# Patient Record
Sex: Male | Born: 1955 | Race: White | Hispanic: No | Marital: Married | Smoking: Smoker, current status unknown
Health system: Southern US, Community
[De-identification: ages and names within clinical notes are randomized; demographics above are authoritative.]

## PROBLEM LIST (undated history)

## (undated) DIAGNOSIS — R188 Other ascites: Secondary | ICD-10-CM

## (undated) DIAGNOSIS — C220 Liver cell carcinoma: Secondary | ICD-10-CM

## (undated) DIAGNOSIS — I8500 Esophageal varices without bleeding: Secondary | ICD-10-CM

## (undated) DIAGNOSIS — B1920 Unspecified viral hepatitis C without hepatic coma: Secondary | ICD-10-CM

## (undated) DIAGNOSIS — K7460 Unspecified cirrhosis of liver: Secondary | ICD-10-CM

## (undated) DIAGNOSIS — A498 Other bacterial infections of unspecified site: Secondary | ICD-10-CM

## (undated) HISTORY — DX: Other ascites: R18.8

---
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
## 2014-01-28 ENCOUNTER — Inpatient Hospital Stay (HOSPITAL_COMMUNITY)
Admission: EM | Admit: 2014-01-28 | Discharge: 2014-02-01 | DRG: 432 | Disposition: A | Discharge status: Home / Self Care | Payer: Medicaid Other | Attending: Internal Medicine | Admitting: Internal Medicine

## 2014-01-28 ENCOUNTER — Encounter (HOSPITAL_COMMUNITY)
Admission: EM | Disposition: A | Discharge status: Home / Self Care | Payer: Medicaid Other | Source: Home / Self Care | Attending: Internal Medicine

## 2014-01-28 ENCOUNTER — Inpatient Hospital Stay (HOSPITAL_COMMUNITY): Payer: Medicaid Other

## 2014-01-28 ENCOUNTER — Encounter (HOSPITAL_COMMUNITY): Payer: Self-pay | Admitting: Emergency Medicine

## 2014-01-28 DIAGNOSIS — R7401 Elevation of levels of liver transaminase levels: Secondary | ICD-10-CM

## 2014-01-28 DIAGNOSIS — E875 Hyperkalemia: Secondary | ICD-10-CM | POA: Diagnosis present

## 2014-01-28 DIAGNOSIS — K7460 Unspecified cirrhosis of liver: Principal | ICD-10-CM | POA: Diagnosis present

## 2014-01-28 DIAGNOSIS — D62 Acute posthemorrhagic anemia: Secondary | ICD-10-CM

## 2014-01-28 DIAGNOSIS — I8511 Secondary esophageal varices with bleeding: Secondary | ICD-10-CM | POA: Diagnosis present

## 2014-01-28 DIAGNOSIS — K922 Gastrointestinal hemorrhage, unspecified: Secondary | ICD-10-CM | POA: Diagnosis present

## 2014-01-28 DIAGNOSIS — D649 Anemia, unspecified: Secondary | ICD-10-CM

## 2014-01-28 DIAGNOSIS — Z7982 Long term (current) use of aspirin: Secondary | ICD-10-CM

## 2014-01-28 DIAGNOSIS — I959 Hypotension, unspecified: Secondary | ICD-10-CM

## 2014-01-28 DIAGNOSIS — R740 Nonspecific elevation of levels of transaminase and lactic acid dehydrogenase [LDH]: Secondary | ICD-10-CM

## 2014-01-28 DIAGNOSIS — D72829 Elevated white blood cell count, unspecified: Secondary | ICD-10-CM

## 2014-01-28 DIAGNOSIS — K921 Melena: Secondary | ICD-10-CM | POA: Diagnosis present

## 2014-01-28 DIAGNOSIS — B1920 Unspecified viral hepatitis C without hepatic coma: Secondary | ICD-10-CM

## 2014-01-28 DIAGNOSIS — F17200 Nicotine dependence, unspecified, uncomplicated: Secondary | ICD-10-CM | POA: Diagnosis present

## 2014-01-28 DIAGNOSIS — Z720 Tobacco use: Secondary | ICD-10-CM

## 2014-01-28 DIAGNOSIS — A0472 Enterocolitis due to Clostridium difficile, not specified as recurrent: Secondary | ICD-10-CM

## 2014-01-28 DIAGNOSIS — K766 Portal hypertension: Secondary | ICD-10-CM | POA: Diagnosis present

## 2014-01-28 DIAGNOSIS — Z23 Encounter for immunization: Secondary | ICD-10-CM

## 2014-01-28 HISTORY — PX: ESOPHAGOGASTRODUODENOSCOPY: SHX5428

## 2014-01-28 LAB — CBC
HCT: 25 % — ABNORMAL LOW (ref 39.0–52.0)
HCT: 20.9 % — ABNORMAL LOW (ref 39.0–52.0)
HEMOGLOBIN: 8.5 g/dL — AB (ref 13.0–17.0)
Hemoglobin: 7.4 g/dL — ABNORMAL LOW (ref 13.0–17.0)
MCH: 33.1 pg (ref 26.0–34.0)
MCH: 34.1 pg — ABNORMAL HIGH (ref 26.0–34.0)
MCHC: 34 g/dL (ref 30.0–36.0)
MCHC: 35.4 g/dL (ref 30.0–36.0)
MCV: 97.3 fL (ref 78.0–100.0)
MCV: 96.3 fL (ref 78.0–100.0)
PLATELETS: 127 10*3/uL — AB (ref 150–400)
Platelets: 182 10*3/uL (ref 150–400)
RBC: 2.57 MIL/uL — ABNORMAL LOW (ref 4.22–5.81)
RBC: 2.17 MIL/uL — ABNORMAL LOW (ref 4.22–5.81)
RDW: 15.2 % (ref 11.5–15.5)
RDW: 15.3 % (ref 11.5–15.5)
WBC: 23.3 10*3/uL — AB (ref 4.0–10.5)
WBC: 18.2 10*3/uL — ABNORMAL HIGH (ref 4.0–10.5)

## 2014-01-28 LAB — BASIC METABOLIC PANEL
BUN: 52 mg/dL — ABNORMAL HIGH (ref 6–23)
BUN: 48 mg/dL — ABNORMAL HIGH (ref 6–23)
CO2: 19 mEq/L (ref 19–32)
CO2: 17 mEq/L — ABNORMAL LOW (ref 19–32)
CREATININE: 1.1 mg/dL (ref 0.50–1.35)
Calcium: 8.1 mg/dL — ABNORMAL LOW (ref 8.4–10.5)
Calcium: 7.8 mg/dL — ABNORMAL LOW (ref 8.4–10.5)
Chloride: 101 mEq/L (ref 96–112)
Chloride: 105 mEq/L (ref 96–112)
Creatinine, Ser: 1.27 mg/dL (ref 0.50–1.35)
GFR calc Af Amer: 70 mL/min — ABNORMAL LOW (ref >90)
GFR calc non Af Amer: 72 mL/min — ABNORMAL LOW (ref >90)
GFR, EST AFRICAN AMERICAN: 83 mL/min — AB (ref >90)
GFR, EST NON AFRICAN AMERICAN: 60 mL/min — AB (ref >90)
Glucose, Bld: 102 mg/dL — ABNORMAL HIGH (ref 70–99)
Glucose, Bld: 104 mg/dL — ABNORMAL HIGH (ref 70–99)
POTASSIUM: 5.7 meq/L — AB (ref 3.7–5.3)
Potassium: 5.7 mEq/L — ABNORMAL HIGH (ref 3.7–5.3)
Sodium: 136 mEq/L — ABNORMAL LOW (ref 137–147)
Sodium: 138 mEq/L (ref 137–147)

## 2014-01-28 LAB — AMMONIA: AMMONIA: 38 umol/L (ref 11–60)

## 2014-01-28 LAB — COMPREHENSIVE METABOLIC PANEL
ALBUMIN: 2.6 g/dL — AB (ref 3.5–5.2)
ALK PHOS: 105 U/L (ref 39–117)
ALT: 139 U/L — ABNORMAL HIGH (ref 0–53)
AST: 166 U/L — AB (ref 0–37)
BUN: 51 mg/dL — ABNORMAL HIGH (ref 6–23)
CHLORIDE: 102 meq/L (ref 96–112)
CO2: 19 mEq/L (ref 19–32)
Calcium: 8.2 mg/dL — ABNORMAL LOW (ref 8.4–10.5)
Creatinine, Ser: 1.27 mg/dL (ref 0.50–1.35)
GFR calc Af Amer: 70 mL/min — ABNORMAL LOW (ref >90)
GFR calc non Af Amer: 60 mL/min — ABNORMAL LOW (ref >90)
Glucose, Bld: 109 mg/dL — ABNORMAL HIGH (ref 70–99)
Potassium: 6 mEq/L — ABNORMAL HIGH (ref 3.7–5.3)
SODIUM: 137 meq/L (ref 137–147)
Total Bilirubin: 2.6 mg/dL — ABNORMAL HIGH (ref 0.3–1.2)
Total Protein: 5.9 g/dL — ABNORMAL LOW (ref 6.0–8.3)

## 2014-01-28 LAB — PROTIME-INR
INR: 1.58 — ABNORMAL HIGH (ref 0.00–1.49)
Prothrombin Time: 18.4 seconds — ABNORMAL HIGH (ref 11.6–15.2)

## 2014-01-28 LAB — TROPONIN I: Troponin I: <0.3 ng/mL (ref <0.30)

## 2014-01-28 LAB — PREPARE RBC (CROSSMATCH)

## 2014-01-28 LAB — I-STAT CHEM 8, ED
BUN: 47 mg/dL — AB (ref 6–23)
CHLORIDE: 105 meq/L (ref 96–112)
Calcium, Ion: 1.07 mmol/L — ABNORMAL LOW (ref 1.12–1.23)
Creatinine, Ser: 1.3 mg/dL (ref 0.50–1.35)
Glucose, Bld: 103 mg/dL — ABNORMAL HIGH (ref 70–99)
HCT: 28 % — ABNORMAL LOW (ref 39.0–52.0)
Hemoglobin: 9.5 g/dL — ABNORMAL LOW (ref 13.0–17.0)
POTASSIUM: 5.6 meq/L — AB (ref 3.7–5.3)
SODIUM: 138 meq/L (ref 137–147)
TCO2: 20 mmol/L (ref 0–100)

## 2014-01-28 LAB — MRSA PCR SCREENING: MRSA BY PCR: NEGATIVE

## 2014-01-28 LAB — LIPASE, BLOOD: Lipase: 40 U/L (ref 11–59)

## 2014-01-28 LAB — ABO/RH: ABO/RH(D): O POS

## 2014-01-28 LAB — APTT: APTT: 34 s (ref 24–37)

## 2014-01-28 LAB — I-STAT CG4 LACTIC ACID, ED: Lactic Acid, Venous: 4.13 mmol/L — ABNORMAL HIGH (ref 0.5–2.2)

## 2014-01-28 LAB — POC OCCULT BLOOD, ED: FECAL OCCULT BLD: POSITIVE — AB

## 2014-01-28 SURGERY — EGD (ESOPHAGOGASTRODUODENOSCOPY)
Anesthesia: Moderate Sedation

## 2014-01-28 MED ORDER — SODIUM CHLORIDE 0.9 % IV SOLN
1000.0000 mL | Freq: Once | INTRAVENOUS | Status: AC
  Administered 2014-01-28: 1000 mL via INTRAVENOUS

## 2014-01-28 MED ORDER — BUTAMBEN-TETRACAINE-BENZOCAINE 2-2-14 % EX AERO
INHALATION_SPRAY | CUTANEOUS | Status: DC | PRN
  Administered 2014-01-28: 2 via TOPICAL

## 2014-01-28 MED ORDER — ONDANSETRON HCL 4 MG PO TABS
4.0000 mg | ORAL_TABLET | Freq: Four times a day (QID) | ORAL | Status: DC | PRN
  Administered 2014-01-29 – 2014-01-30 (×4): 4 mg via ORAL
  Filled 2014-01-28 (×4): qty 1

## 2014-01-28 MED ORDER — DEXTROSE 5 % IV SOLN: 1.0000 g | INTRAVENOUS | Status: DC

## 2014-01-28 MED ORDER — SODIUM CHLORIDE 0.9 % IV SOLN
1000.0000 mL | INTRAVENOUS | Status: DC
  Administered 2014-01-28: 1000 mL via INTRAVENOUS

## 2014-01-28 MED ORDER — MORPHINE SULFATE 2 MG/ML IJ SOLN
2.0000 mg | INTRAMUSCULAR | Status: DC | PRN
  Administered 2014-01-28 – 2014-01-29 (×5): 2 mg via INTRAVENOUS
  Administered 2014-01-30: 4 mg via INTRAVENOUS
  Filled 2014-01-28: qty 1
  Filled 2014-01-28: qty 2
  Filled 2014-01-28 (×4): qty 1

## 2014-01-28 MED ORDER — ONDANSETRON HCL 4 MG/2ML IJ SOLN: 4.0000 mg | Freq: Four times a day (QID) | INTRAMUSCULAR | Status: DC | PRN

## 2014-01-28 MED ORDER — SODIUM CHLORIDE 0.9 % IV SOLN
80.0000 mg | Freq: Once | INTRAVENOUS | Status: AC
  Administered 2014-01-28: 80 mg via INTRAVENOUS
  Filled 2014-01-28: qty 80

## 2014-01-28 MED ORDER — SODIUM CHLORIDE 0.9 % IV SOLN
INTRAVENOUS | Status: DC
  Administered 2014-01-28: 13:00:00 via INTRAVENOUS

## 2014-01-28 MED ORDER — SODIUM CHLORIDE 0.9 % IV SOLN
INTRAVENOUS | Status: DC
  Administered 2014-01-28 – 2014-01-29 (×3): via INTRAVENOUS
  Administered 2014-01-29: 1000 mL via INTRAVENOUS
  Administered 2014-01-29: 150 mL/h via INTRAVENOUS
  Administered 2014-01-30 – 2014-02-01 (×8): via INTRAVENOUS

## 2014-01-28 MED ORDER — ONDANSETRON HCL 4 MG/2ML IJ SOLN
INTRAMUSCULAR | Status: AC
  Administered 2014-01-28: 4 mg
  Filled 2014-01-28: qty 2

## 2014-01-28 MED ORDER — FENTANYL CITRATE 0.05 MG/ML IJ SOLN
INTRAMUSCULAR | Status: AC
  Filled 2014-01-28: qty 2

## 2014-01-28 MED ORDER — IOHEXOL 300 MG/ML  SOLN
100.0000 mL | Freq: Once | INTRAMUSCULAR | Status: AC | PRN
  Administered 2014-01-28: 100 mL via INTRAVENOUS

## 2014-01-28 MED ORDER — PNEUMOCOCCAL VAC POLYVALENT 25 MCG/0.5ML IJ INJ
0.5000 mL | INJECTION | INTRAMUSCULAR | Status: AC
  Administered 2014-01-29: 0.5 mL via INTRAMUSCULAR
  Filled 2014-01-28: qty 0.5

## 2014-01-28 MED ORDER — MIDAZOLAM HCL 10 MG/2ML IJ SOLN
INTRAMUSCULAR | Status: DC | PRN
  Administered 2014-01-28: 2 mg via INTRAVENOUS
  Administered 2014-01-28: 1 mg via INTRAVENOUS
  Administered 2014-01-28: 2 mg via INTRAVENOUS

## 2014-01-28 MED ORDER — SODIUM CHLORIDE 0.9 % IV SOLN: INTRAVENOUS | Status: DC

## 2014-01-28 MED ORDER — DEXTROSE 5 % IV SOLN
1.0000 g | INTRAVENOUS | Status: DC
Start: 2014-01-28 — End: 2014-01-31
  Administered 2014-01-28 – 2014-01-30 (×3): 1 g via INTRAVENOUS
  Filled 2014-01-28 (×5): qty 10

## 2014-01-28 MED ORDER — SODIUM BICARBONATE 8.4 % IV SOLN
50.0000 meq | Freq: Once | INTRAVENOUS | Status: AC
  Administered 2014-01-28: 50 meq via INTRAVENOUS
  Filled 2014-01-28: qty 50

## 2014-01-28 MED ORDER — CALCIUM GLUCONATE 10 % IV SOLN
1.0000 g | Freq: Once | INTRAVENOUS | Status: AC
  Administered 2014-01-28: 1 g via INTRAVENOUS
  Filled 2014-01-28: qty 10

## 2014-01-28 MED ORDER — SODIUM CHLORIDE 0.9 % IJ SOLN
3.0000 mL | Freq: Two times a day (BID) | INTRAMUSCULAR | Status: DC
  Administered 2014-01-28 – 2014-01-29 (×2): 3 mL via INTRAVENOUS

## 2014-01-28 MED ORDER — ONDANSETRON HCL 4 MG/2ML IJ SOLN
4.0000 mg | Freq: Once | INTRAMUSCULAR | Status: AC
  Administered 2014-01-28: 4 mg via INTRAVENOUS
  Filled 2014-01-28: qty 2

## 2014-01-28 MED ORDER — MIDAZOLAM HCL 5 MG/ML IJ SOLN
INTRAMUSCULAR | Status: AC
  Filled 2014-01-28: qty 1

## 2014-01-28 MED ORDER — IOHEXOL 300 MG/ML  SOLN
25.0000 mL | INTRAMUSCULAR | Status: AC
  Administered 2014-01-28 (×2): 25 mL via ORAL

## 2014-01-28 MED ORDER — SODIUM CHLORIDE 0.9 % IV SOLN
8.0000 mg/h | INTRAVENOUS | Status: DC
  Administered 2014-01-28 – 2014-01-29 (×4): 8 mg/h via INTRAVENOUS
  Filled 2014-01-28 (×10): qty 80

## 2014-01-28 MED ORDER — FENTANYL CITRATE 0.05 MG/ML IJ SOLN
INTRAMUSCULAR | Status: DC | PRN
  Administered 2014-01-28 (×3): 25 ug via INTRAVENOUS

## 2014-01-28 NOTE — Progress Notes (Signed)
Patient unable to consistently maintain sats >90%.  Patient placed on 2L nasal cannula and instructed to take several deep breaths through his nose.  O2 sats increased to >95%.  Will continue to monitor and titrate O2 as needed.

## 2014-01-28 NOTE — ED Notes (Signed)
Portable xray at bedside.

## 2014-01-28 NOTE — ED Notes (Signed)
Pharmacy called for protonix stat

## 2014-01-28 NOTE — Consult Note (Addendum)
Reason for Consult: Hematemesis, melena, and anemia Referring Physician: [facility]  Barry Dienes HPI: This is a 58 year old male without any significant PMH admitted for hematemesis, melena, and anemia.  Acutely he woke up this morning at 4:30 AM and started to have hematemesis followed by melena.  No prior history of any of these symptoms.  He denies any GERD issues and he also denies any abdominal pain.  Over the past two weeks he was taking ASA for his right wrist.  He took two tablets per day in the morning.  The patient has a 20 pack year history of smoking, but not ETOH.  Currently he feels well.  An NG tube was placed in the ER and there was no evidence of any blood in the aspirate, however, the patient reported vomiting clots.  History reviewed. No pertinent past medical history.  History reviewed. No pertinent past surgical history.  History reviewed. No pertinent family history.  Social History:  reports that he has been smoking Cigarettes.  He has a 30 pack-year smoking history. He does not have any smokeless tobacco history on file. He reports that he does not drink alcohol or use illicit drugs.  Allergies: No Known Allergies  Medications:  Scheduled: . sodium chloride  3 mL Intravenous Q12H   Continuous: . sodium chloride 1,000 mL (01/28/14 0951)  . sodium chloride 20 mL/hr at 01/28/14 1312  . sodium chloride 150 mL/hr at 01/28/14 1238  . pantoprozole (PROTONIX) infusion 8 mg/hr (01/28/14 0854)    Results for orders placed during the hospital encounter of 01/28/14 (from the past 24 hour(s))  AMMONIA     Status: None   Collection Time    01/28/14  8:16 AM      Result Value Ref Range   Ammonia 38  11 - 60 umol/L  TYPE AND SCREEN     Status: None   Collection Time    01/28/14  8:16 AM      Result Value Ref Range   ABO/RH(D) O POS     Antibody Screen NEG     Sample Expiration 01/31/2014     Unit Number [id number]     Blood Component Type RED CELLS,LR     Unit division 00     Status of Unit ALLOCATED     Transfusion Status OK TO TRANSFUSE     Crossmatch Result Compatible     Unit Number [id number]     Blood Component Type RED CELLS,LR     Unit division 00     Status of Unit ALLOCATED     Transfusion Status OK TO TRANSFUSE     Crossmatch Result Compatible    ABO/RH     Status: None   Collection Time    01/28/14  8:16 AM      Result Value Ref Range   ABO/RH(D) O POS    BASIC METABOLIC PANEL     Status: Abnormal   Collection Time    01/28/14  8:16 AM      Result Value Ref Range   Sodium 136 (*) 137 - 147 mEq/L   Potassium 5.7 (*) 3.7 - 5.3 mEq/L   Chloride 101  96 - 112 mEq/L   CO2 19  19 - 32 mEq/L   Glucose, Bld 102 (*) 70 - 99 mg/dL   BUN 52 (*) 6 - 23 mg/dL   Creatinine, Ser 3.89  0.50 - 1.35 mg/dL   Calcium 8.1 (*) 8.4 - 10.5 mg/dL  GFR calc non Af Amer 60 (*) >90 mL/min   GFR calc Af Amer 70 (*) >90 mL/min  CBC     Status: Abnormal   Collection Time    01/28/14  8:21 AM      Result Value Ref Range   WBC 23.3 (*) 4.0 - 10.5 K/uL   RBC 2.57 (*) 4.22 - 5.81 MIL/uL   Hemoglobin 8.5 (*) 13.0 - 17.0 g/dL   HCT 25.0 (*) 39.0 - 52.0 %   MCV 97.3  78.0 - 100.0 fL   MCH 33.1  26.0 - 34.0 pg   MCHC 34.0  30.0 - 36.0 g/dL   RDW 15.2  11.5 - 15.5 %   Platelets 182  150 - 400 K/uL  COMPREHENSIVE METABOLIC PANEL     Status: Abnormal   Collection Time    01/28/14  8:21 AM      Result Value Ref Range   Sodium 137  137 - 147 mEq/L   Potassium 6.0 (*) 3.7 - 5.3 mEq/L   Chloride 102  96 - 112 mEq/L   CO2 19  19 - 32 mEq/L   Glucose, Bld 109 (*) 70 - 99 mg/dL   BUN 51 (*) 6 - 23 mg/dL   Creatinine, Ser 1.27  0.50 - 1.35 mg/dL   Calcium 8.2 (*) 8.4 - 10.5 mg/dL   Total Protein 5.9 (*) 6.0 - 8.3 g/dL   Albumin 2.6 (*) 3.5 - 5.2 g/dL   AST 166 (*) 0 - 37 U/L   ALT 139 (*) 0 - 53 U/L   Alkaline Phosphatase 105  39 - 117 U/L   Total Bilirubin 2.6 (*) 0.3 - 1.2 mg/dL   GFR calc non Af Amer 60 (*) >90 mL/min   GFR calc Af Amer  70 (*) >90 mL/min  LIPASE, BLOOD     Status: None   Collection Time    01/28/14  8:21 AM      Result Value Ref Range   Lipase 40  11 - 59 U/L  PROTIME-INR     Status: Abnormal   Collection Time    01/28/14  8:21 AM      Result Value Ref Range   Prothrombin Time 18.4 (*) 11.6 - 15.2 seconds   INR 1.58 (*) 0.00 - 1.49  APTT     Status: None   Collection Time    01/28/14  8:21 AM      Result Value Ref Range   aPTT 34  24 - 37 seconds  TROPONIN I     Status: None   Collection Time    01/28/14  8:21 AM      Result Value Ref Range   Troponin I <0.30  <0.30 ng/mL  POC OCCULT BLOOD, ED     Status: Abnormal   Collection Time    01/28/14  8:30 AM      Result Value Ref Range   Fecal Occult Bld POSITIVE (*) NEGATIVE  I-STAT CHEM 8, ED     Status: Abnormal   Collection Time    01/28/14  8:31 AM      Result Value Ref Range   Sodium 138  137 - 147 mEq/L   Potassium 5.6 (*) 3.7 - 5.3 mEq/L   Chloride 105  96 - 112 mEq/L   BUN 47 (*) 6 - 23 mg/dL   Creatinine, Ser 1.30  0.50 - 1.35 mg/dL   Glucose, Bld 103 (*) 70 - 99 mg/dL   Calcium, Ion 1.07 (*)  1.12 - 1.23 mmol/L   TCO2 20  0 - 100 mmol/L   Hemoglobin 9.5 (*) 13.0 - 17.0 g/dL   HCT 28.0 (*) 39.0 - 52.0 %  I-STAT CG4 LACTIC ACID, ED     Status: Abnormal   Collection Time    01/28/14  8:31 AM      Result Value Ref Range   Lactic Acid, Venous 4.13 (*) 0.5 - 2.2 mmol/L  PREPARE RBC (CROSSMATCH)     Status: None   Collection Time    01/28/14 10:00 AM      Result Value Ref Range   Order Confirmation ORDER PROCESSED BY BLOOD BANK    MRSA PCR SCREENING     Status: None   Collection Time    01/28/14 11:14 AM      Result Value Ref Range   MRSA by PCR NEGATIVE  NEGATIVE  CBC     Status: Abnormal   Collection Time    01/28/14 12:45 PM      Result Value Ref Range   WBC 18.2 (*) 4.0 - 10.5 K/uL   RBC 2.17 (*) 4.22 - 5.81 MIL/uL   Hemoglobin 7.4 (*) 13.0 - 17.0 g/dL   HCT 20.9 (*) 39.0 - 52.0 %   MCV 96.3  78.0 - 100.0 fL   MCH  34.1 (*) 26.0 - 34.0 pg   MCHC 35.4  30.0 - 36.0 g/dL   RDW 15.3  11.5 - 15.5 %   Platelets 127 (*) 150 - 400 K/uL  TROPONIN I     Status: None   Collection Time    01/28/14 12:45 PM      Result Value Ref Range   Troponin I <0.30  <0.30 ng/mL  BASIC METABOLIC PANEL     Status: Abnormal   Collection Time    01/28/14 12:45 PM      Result Value Ref Range   Sodium 138  137 - 147 mEq/L   Potassium 5.7 (*) 3.7 - 5.3 mEq/L   Chloride 105  96 - 112 mEq/L   CO2 17 (*) 19 - 32 mEq/L   Glucose, Bld 104 (*) 70 - 99 mg/dL   BUN 48 (*) 6 - 23 mg/dL   Creatinine, Ser 1.10  0.50 - 1.35 mg/dL   Calcium 7.8 (*) 8.4 - 10.5 mg/dL   GFR calc non Af Amer 72 (*) >90 mL/min   GFR calc Af Amer 83 (*) >90 mL/min  PREPARE RBC (CROSSMATCH)     Status: None   Collection Time    01/28/14  1:31 PM      Result Value Ref Range   Order Confirmation BB SAMPLE OR UNITS ALREADY AVAILABLE       Dg Chest Portable 1 View  01/28/2014   CLINICAL DATA:  GI bleed  EXAM: PORTABLE CHEST - 1 VIEW  COMPARISON:  None.  FINDINGS: Mild/vague right lower lobe opacity is favored to reflect scarring/atelectasis. Lungs are otherwise clear. No pleural effusion or pneumothorax.  The heart is normal in size.  Enteric tube coursing into the stomach.  IMPRESSION: No evidence of acute cardiopulmonary disease.   Electronically Signed   By: Julian Hy M.D.   On: 01/28/2014 10:12    ROS:  As stated above in the HPI otherwise negative.  Blood pressure 127/51, pulse 131, temperature 97.6 F (36.4 C), temperature source Oral, resp. rate 26, height 5\' 11"  (1.803 m), weight 217 lb 9.5 oz (98.7 kg), SpO2 87.00%.    PE: Gen: NAD,  Alert and Oriented HEENT:  [place]/AT, EOMI Neck: Supple, no LAD Lungs: CTA Bilaterally CV: RRR without M/G/R ABM: Soft, NTND, +BS Ext: No C/C/E  Assessment/Plan: 1) Hematemesis. 2) Melena. 3) Anemia. 4) Abnormal liver enzymes.   It appears that he has an upper GI source of bleeding.  Further evaluation  is required with an EGD.  His vitals signs have improved with IV hydration.  Plan: 1) EGD now. 2) Check for HBV and HCV.  Beryle Beams 01/28/2014, 2:42 PM

## 2014-01-28 NOTE — ED Notes (Signed)
MD hearing stomach sound; suction connected and getting gastric contents and blood.

## 2014-01-28 NOTE — ED Notes (Signed)
NG tube placed. Confirmed stomach sounds; no gastric content return with suction; tube adjusted; 2 RNs unable to hear sounds; visualized tube in back of throat. MD at bedside. Portable xray ordered for better assessment.

## 2014-01-28 NOTE — ED Notes (Signed)
MD at bedside. 

## 2014-01-28 NOTE — ED Notes (Signed)
Attempted to call report

## 2014-01-28 NOTE — ED Provider Notes (Signed)
CSN: [id number]     Arrival date & time 01/28/14  0759 History   First MD Initiated Contact with Patient 01/28/14 [phone]     Chief Complaint  Patient presents with  . Hematemesis  . GI Bleeding     (Consider location/radiation/quality/duration/timing/severity/associated sxs/prior Treatment) HPI Comments: Patient presents with vomiting blood and dark tarry stools since yesterday. He states he's had 4-5 episodes of frank hematemesis since yesterday with blood clots. He said dark tarry stools as well. Denies any abdominal pain, chest pain, shortness of breath. He endorses dizziness and lightheadedness. No previous history of GI bleed. Denies any heavy alcohol use. Denies any previous history of liver problems. He takes 2 aspirin daily for wrist pain. He's never had a colonoscopy. Denies any cardiac or pulmonary history.  The history is provided by the patient and the spouse.    History reviewed. No pertinent past medical history. History reviewed. No pertinent past surgical history. History reviewed. No pertinent family history. History  Substance Use Topics  . Smoking status: Smoker, Current Status Unknown -- 1.00 packs/day for 30 years    Types: Cigarettes  . Smokeless tobacco: Not on file  . Alcohol Use: No    Review of Systems  Constitutional: Positive for activity change, appetite change and fatigue. Negative for fever.  HENT: Negative for congestion and rhinorrhea.   Respiratory: Negative for cough, chest tightness and shortness of breath.   Cardiovascular: Negative for chest pain.  Gastrointestinal: Positive for nausea, vomiting and blood in stool. Negative for abdominal pain.  Genitourinary: Negative for dysuria and hematuria.  Musculoskeletal: Negative for arthralgias, myalgias and neck pain.  Neurological: Positive for dizziness, weakness and light-headedness.  A complete 10 system review of systems was obtained and all systems are negative except as noted in the HPI and  PMH.      Allergies  Review of patient's allergies indicates no known allergies.  Home Medications   Prior to Admission medications   Not on File   BP 109/61  Pulse 95  Temp(Src) 98 F (36.7 C) (Axillary)  Resp 18  Ht 5\' 11"  (1.803 m)  Wt 217 lb 9.5 oz (98.7 kg)  BMI 30.36 kg/m2  SpO2 90% Physical Exam  Constitutional: He is oriented to person, place, and time. He appears well-developed and well-nourished. No distress.  Appears pale  HENT:  Head: Normocephalic and atraumatic.  Mouth/Throat: Oropharynx is clear and moist. No oropharyngeal exudate.  Eyes: Conjunctivae and EOM are normal. Pupils are equal, round, and reactive to light.  Neck: Normal range of motion. Neck supple.  Cardiovascular: Normal rate, regular rhythm and normal heart sounds.   No murmur heard. Pulmonary/Chest: Effort normal and breath sounds normal. No respiratory distress.  Abdominal: Soft. There is no tenderness. There is no rebound and no guarding.  Genitourinary: Guaiac positive stool.  Musculoskeletal: Normal range of motion. He exhibits no edema and no tenderness.  Neurological: He is alert and oriented to person, place, and time. No cranial nerve deficit. He exhibits normal muscle tone. Coordination normal.  Skin: Skin is warm.    ED Course  Procedures (including critical care time) Labs Review Labs Reviewed  CBC - Abnormal; Notable for the following:    WBC 23.3 (*)    RBC 2.57 (*)    Hemoglobin 8.5 (*)    HCT 25.0 (*)    All other components within normal limits  COMPREHENSIVE METABOLIC PANEL - Abnormal; Notable for the following:    Potassium 6.0 (*)  Glucose, Bld 109 (*)    BUN 51 (*)    Calcium 8.2 (*)    Total Protein 5.9 (*)    Albumin 2.6 (*)    AST 166 (*)    ALT 139 (*)    Total Bilirubin 2.6 (*)    GFR calc non Af Amer 60 (*)    GFR calc Af Amer 70 (*)    All other components within normal limits  PROTIME-INR - Abnormal; Notable for the following:    Prothrombin  Time 18.4 (*)    INR 1.58 (*)    All other components within normal limits  BASIC METABOLIC PANEL - Abnormal; Notable for the following:    Sodium 136 (*)    Potassium 5.7 (*)    Glucose, Bld 102 (*)    BUN 52 (*)    Calcium 8.1 (*)    GFR calc non Af Amer 60 (*)    GFR calc Af Amer 70 (*)    All other components within normal limits  CBC - Abnormal; Notable for the following:    WBC 18.2 (*)    RBC 2.17 (*)    Hemoglobin 7.4 (*)    HCT 20.9 (*)    MCH 34.1 (*)    Platelets 127 (*)    All other components within normal limits  BASIC METABOLIC PANEL - Abnormal; Notable for the following:    Potassium 5.7 (*)    CO2 17 (*)    Glucose, Bld 104 (*)    BUN 48 (*)    Calcium 7.8 (*)    GFR calc non Af Amer 72 (*)    GFR calc Af Amer 83 (*)    All other components within normal limits  I-STAT CHEM 8, ED - Abnormal; Notable for the following:    Potassium 5.6 (*)    BUN 47 (*)    Glucose, Bld 103 (*)    Calcium, Ion 1.07 (*)    Hemoglobin 9.5 (*)    HCT 28.0 (*)    All other components within normal limits  POC OCCULT BLOOD, ED - Abnormal; Notable for the following:    Fecal Occult Bld POSITIVE (*)    All other components within normal limits  I-STAT CG4 LACTIC ACID, ED - Abnormal; Notable for the following:    Lactic Acid, Venous 4.13 (*)    All other components within normal limits  MRSA PCR SCREENING  AMMONIA  LIPASE, BLOOD  APTT  TROPONIN I  TROPONIN I  CBC  CBC  TROPONIN I  HEPATITIS PANEL, ACUTE  HIV ANTIBODY (ROUTINE TESTING)  HEPATITIS B SURFACE ANTIGEN  HEPATITIS B SURFACE ANTIBODY  HEPATITIS C ANTIBODY  AFP TUMOR MARKER  COMPREHENSIVE METABOLIC PANEL  CBC  TYPE AND SCREEN  ABO/RH  PREPARE RBC (CROSSMATCH)  PREPARE RBC (CROSSMATCH)    Imaging Review Dg Chest Portable 1 View  01/28/2014   CLINICAL DATA:  GI bleed  EXAM: PORTABLE CHEST - 1 VIEW  COMPARISON:  None.  FINDINGS: Mild/vague right lower lobe opacity is favored to reflect  scarring/atelectasis. Lungs are otherwise clear. No pleural effusion or pneumothorax.  The heart is normal in size.  Enteric tube coursing into the stomach.  IMPRESSION: No evidence of acute cardiopulmonary disease.   Electronically Signed   By: Julian Hy M.D.   On: 01/28/2014 10:12     EKG Interpretation   Date/Time:  Thursday January 28 2014 08:37:47 EDT Ventricular Rate:  84 PR Interval:  127 QRS Duration:  75 QT Interval:  386 QTC Calculation: 456 R Axis:   60 Text Interpretation:  Sinus rhythm No previous ECGs available Confirmed by  Blasa Raisch  MD, Aaban Griep [phone]) on 01/28/2014 8:46:39 AM      MDM   Final diagnoses:  GI bleed  Hyperkalemia   Hematemesis with dark tarry stools for the past 2 days. Patient appears pale. Mild tachycardia. Abdomen soft and nontender.  Large bore IVs, IV fluids, IV PPI, type and screen. Hemoglobin 8.5, no comparison, type and screen sent. D/w Dr Benson Norway.  Requests NG placement.   BP remains stable in ED with systolic 967-893Y.  HR 100s. NG placed with minimal output of gastric contents. Hyperkalemia of 5.7 confirmed on repeat. No EKG changes.  Bicarb given.  Hold kayexalate in setting of GI bleed.  [facility] residents to admit for unassigned. Dr. Benson Norway to consult for EGD.  BP 109/61  Pulse 95  Temp(Src) 98 F (36.7 C) (Axillary)  Resp 18  Ht 5\' 11"  (1.803 m)  Wt 217 lb 9.5 oz (98.7 kg)  BMI 30.36 kg/m2  SpO2 89%  CRITICAL CARE Performed by: Ezequiel Essex Total critical care time: 30 Critical care time was exclusive of separately billable procedures and treating other patients. Critical care was necessary to treat or prevent imminent or life-threatening deterioration. Critical care was time spent personally by me on the following activities: development of treatment plan with patient and/or surrogate as well as nursing, discussions with consultants, evaluation of patient's response to treatment, examination of patient, obtaining history  from patient or surrogate, ordering and performing treatments and interventions, ordering and review of laboratory studies, ordering and review of radiographic studies, pulse oximetry and re-evaluation of patient's condition.    Ezequiel Essex, MD 01/28/14 [phone]

## 2014-01-28 NOTE — ED Notes (Signed)
Pt reports 1 episode frank bloody vomiting; black tarry stools. Denies GI issues, alcohol use, no blood thinners.

## 2014-01-28 NOTE — ED Notes (Signed)
Lactic acid results given to Dr. Wyvonnia Dusky

## 2014-01-28 NOTE — Op Note (Signed)
[facility] [place], [id number]   OPERATIVE PROCEDURE REPORT  PATIENT: George, Fuller  MR#: [id number] BIRTHDATE: December 06, 1955  GENDER: Male ENDOSCOPIST: Carol Ada, MD ASSISTANT:   Sharon Mt, Endo Technician and Cleda Daub, RN CGRN PROCEDURE DATE: 01/28/2014 PROCEDURE:   EGD ASA CLASS:   Class II INDICATIONS: Hematemesis, melena, and anemia MEDICATIONS: Versed 7 mg IV and Fentanyl 75 mcg IV TOPICAL ANESTHETIC:   Cetacaine Spray  DESCRIPTION OF PROCEDURE:   After the risks benefits and alternatives of the procedure were thoroughly explained, informed consent was obtained.  The Pentax Gastroscope [id number]  endoscope was introduced through the mouth  and advanced to the second portion of the duodenum Without limitations.      The instrument was slowly withdrawn as the mucosa was fully examined.      FINDINGS:  The esophagus exhibited medium to large esohageal varices.  No evidence of any sites of bleeding from the varices. There was no evidence of any blood in the upper GI tract.  The gastric mucosa revealed portal HTN gastropathy.  No evidence of fundic varices.  The duodenum was normal.   The endoscope was withdrawn back into the esophagus and seven bands were successfully placed in the distal esohpagus.  Retroflexed views revealed no abnormalities.     The scope was then withdrawn from the patient and the procedure terminated.  COMPLICATIONS: There were no complications. IMPRESSION: 1) Medium to large esophageal varices s/p banding. 2) Portal HTN.  RECOMMENDATIONS: 1) Repeat EGD with banding in 2-3 weeks. 2) Start Ceftriaxone. 3) Check HBV and HCV serologies. 4) Image liver and obtain an AFP.  _______________________________ eSigned:  Carol Ada, MD 01/28/2014 3:29 PM    PATIENT NAME:  George, Fuller MR#: [id number]

## 2014-01-28 NOTE — H&P (Signed)
Date: 01/28/2014               Patient Name:  George Fuller MRN: [id number]  DOB: August 20, 1956 Age / Sex: 58 y.o., male   PCP: No primary provider on file.         Medical Service: Internal Medicine Teaching Service         Attending Physician: Dr. Madilyn Fireman, MD    First Contact: Dr. Mechele Claude Pager: [phone]  Second Contact: Dr. Eulas Post Pager: [phone]       After Hours (After 5p/  First Contact Pager: [phone]  weekends / holidays): Second Contact Pager: [phone]   Chief Complaint: vomiting blood  History of Present Illness:  The patient is a 58 YO man who has no significant past medical history who is presenting with a 2 day history of vomiting blood with tarry black stools. He states that it started around 4:30 AM 2 days ago with some stomach discomfort and vomiting bloody material. This continued with some dark tarry stools intermittently over the next day or so. The vomiting continued as well intermittently with blood. The vomiting became more consistent and with clots so he presented to the ED. He was feeling weak the morning of admission but denied lightheadedness or dizziness. He did not pass out. He denies any chest pain or tightness. He denies history of GERD. He denies heavy alcohol usage and none in the past. He is an occasional social drinker. He has been taking about 2 aspirin daily for the last 10 days for some wrist pain. He denies any history of cardiac or breathing problems. He does not have a doctor and has not seen one in some time. His wife is with him and confirms his story. He does not use drugs although he does smoke 1 ppd for 30 years or so.  He denies current abdominal pain although he is having some nausea. No fevers or chills at home. No dysuria or hematuria. He has not had colonoscopy in the past. Patient specifically declines usage of BC powder, goody powder, ibuprofen, naproxen, advil, motrin.   Meds: Current Facility-Administered Medications  Medication Dose  Route Frequency Provider Last Rate Last Dose  . 0.9 %  sodium chloride infusion  1,000 mL Intravenous Continuous Ezequiel Essex, MD 125 mL/hr at 01/28/14 0951 1,000 mL at 01/28/14 0951  . 0.9 %  sodium chloride infusion   Intravenous STAT Ezequiel Essex, MD      . pantoprazole (PROTONIX) 80 mg in sodium chloride 0.9 % 250 mL infusion  8 mg/hr Intravenous Continuous Ezequiel Essex, MD 25 mL/hr at 01/28/14 0854 8 mg/hr at 01/28/14 0854   Current Outpatient Prescriptions  Medication Sig Dispense Refill  . aspirin 325 MG tablet Take 650 mg by mouth daily.      Marland Kitchen MAGNESIUM-ZINC PO Take 1 tablet by mouth daily.      . Potassium (POTASSIMIN PO) Take 1 tablet by mouth daily.        Allergies: Allergies as of 01/28/2014  . (No Known Allergies)   History reviewed. No pertinent past medical history. History reviewed. No pertinent past surgical history. History reviewed. No pertinent family history. History   Social History  . Marital Status: Married    Spouse Name: N/A    Number of Children: N/A  . Years of Education: N/A   Occupational History  . Not on file.   Social History Main Topics  . Smoking status: Not on file  . Smokeless tobacco: Not on file  .  Alcohol Use: Not on file  . Drug Use: Not on file  . Sexual Activity: Not on file   Other Topics Concern  . Not on file   Social History Narrative  . No narrative on file    Review of Systems: A comprehensive 10 point review of systems was performed and other than pertinent positives and negatives listed above in HPI was negative.   Physical Exam: Blood pressure 131/78, pulse 95, temperature 97.6 F (36.4 C), temperature source Oral, resp. rate 18, height 5\' 11"  (1.803 m), weight 200 lb (90.719 kg), SpO2 98.00%. General: resting in bed, NG being placed so mild to moderate discomfort HEENT: PERRL, EOMI, no scleral icterus Cardiac: S1 S2 heard but fast, no murmur Pulm: clear to auscultation bilaterally, moving normal  volumes of air Abd: soft, non-tender, mildly distended, +BS Ext: warm and well perfused, no pedal edema Neuro: alert and oriented X3, cranial nerves II-XII grossly intact  Lab results: Basic Metabolic Panel:  Recent Labs  01/28/14 0821 01/28/14 0831  NA 137 138  K 6.0* 5.6*  CL 102 105  CO2 19  --   GLUCOSE 109* 103*  BUN 51* 47*  CREATININE 1.27 1.30  CALCIUM 8.2*  --    Liver Function Tests:  Recent Labs  01/28/14 0821  AST 166*  ALT 139*  ALKPHOS 105  BILITOT 2.6*  PROT 5.9*  ALBUMIN 2.6*    Recent Labs  01/28/14 0821  LIPASE 40    Recent Labs  01/28/14 0816  AMMONIA 38   CBC:  Recent Labs  01/28/14 0821 01/28/14 0831  WBC 23.3*  --   HGB 8.5* 9.5*  HCT 25.0* 28.0*  MCV 97.3  --   PLT 182  --    Cardiac Enzymes:  Recent Labs  01/28/14 0821  TROPONINI <0.30   BNP: No results found for this basename: PROBNP,  in the last 72 hours D-Dimer: No results found for this basename: DDIMER,  in the last 72 hours CBG: No results found for this basename: GLUCAP,  in the last 72 hours Hemoglobin A1C: No results found for this basename: HGBA1C,  in the last 72 hours Fasting Lipid Panel: No results found for this basename: CHOL, HDL, LDLCALC, TRIG, CHOLHDL, LDLDIRECT,  in the last 72 hours Thyroid Function Tests: No results found for this basename: TSH, T4TOTAL, FREET4, T3FREE, THYROIDAB,  in the last 72 hours Anemia Panel: No results found for this basename: VITAMINB12, FOLATE, FERRITIN, TIBC, IRON, RETICCTPCT,  in the last 72 hours Coagulation:  Recent Labs  01/28/14 0821  LABPROT 18.4*  INR 1.58*   Urine Drug Screen: Drugs of Abuse  No results found for this basename: labopia, cocainscrnur, labbenz, amphetmu, thcu, labbarb    Alcohol Level: No results found for this basename: ETH,  in the last 72 hours Urinalysis: No results found for this basename: COLORURINE, APPERANCEUR, LABSPEC, PHURINE, GLUCOSEU, HGBUR, BILIRUBINUR, KETONESUR,  PROTEINUR, UROBILINOGEN, NITRITE, LEUKOCYTESUR,  in the last 72 hours  Imaging results:  Dg Chest Portable 1 View  01/28/2014   CLINICAL DATA:  GI bleed  EXAM: PORTABLE CHEST - 1 VIEW  COMPARISON:  None.  FINDINGS: Mild/vague right lower lobe opacity is favored to reflect scarring/atelectasis. Lungs are otherwise clear. No pleural effusion or pneumothorax.  The heart is normal in size.  Enteric tube coursing into the stomach.  IMPRESSION: No evidence of acute cardiopulmonary disease.   Electronically Signed   By: Julian Hy M.D.   On: 01/28/2014 10:12  Other results: EKG: normal EKG, normal sinus rhythm, no peaked t waves and no st or t wave changes, no q waves noted.  Assessment & Plan by Problem:  Acute GI bleed - Patient is having a GI bleed, likely upper with hematemasis and melena. GI consulted in ED. Protonix drip initiated. Hg 8.5 which is likely low from baseline. Will repeat CBC at 12PM today. He is type and screened. Will set transfusion threshold at Hg<8. ASA usage is his only risk factor. No heavy alcohol usage or history of GERD. No sypmtoms of anemia at this time. Will trend CE with concern for active bleeding.  -CBC q 6 hours -Admit to stepdown -NG placed per GI and minimal serosanginous drainage coming out -Telemetry -NS @ 150 cc/hr for fluid resuscitation after 2L bolus in ED -2 large bore IVs -Protonix drip -Trend CE  Hypotension - Initially hypotensive in the ED due to acute GI bleed however has responded well to fluid resuscitation with 2 L normal saline and recheck is 130s/80s. Will monitor closely in stepdown for signs of ongoing blood loss.  -NS @ 150 cc/hr -Trend CBC and transfuse as necessary for Hg <8  Elevated transaminase level - Elevated AST and ALT in the setting of hypotension and active GI bleed is likely from the transient hypotensive as mild liver injury. Will check hepatitis panel and trend daily.  -Hepatitis panel -CMP in the AM  Hyperkalemia  - Patient had K drawn in ED which was 6.0 and no apparent reason. Concern for hemolysis and will send stat repeat at this time. He got calcium gluconate in the ED. No EKG changes. Will keep on telemetry for now pending repeat. Potassium supplements on his medication list but he denies taking.  -Repeat BMP stat and treat accordingly  Tobacco abuse - Due to critical illness did not discuss with patient but counseling on cessation should happen this admission.  DVT ppx - SCDs given active GI bleeding  Dispo: Disposition is deferred at this time, awaiting improvement of current medical problems. Anticipated discharge in approximately 2-4 day(s).   The patient does not have a current PCP (No primary provider on file.) and does need an [facility] hospital follow-up appointment after discharge.  The patient does not have transportation limitations that hinder transportation to clinic appointments.  Signed: Olga Millers, MD 01/28/2014, 10:17 AM

## 2014-01-28 NOTE — ED Notes (Signed)
Blood bank called with ready blood; confirmed that resident explained to pt that they will monitor him and then reassess need for transfusion.

## 2014-01-29 ENCOUNTER — Encounter (HOSPITAL_COMMUNITY): Payer: Self-pay | Admitting: Gastroenterology

## 2014-01-29 DIAGNOSIS — I8501 Esophageal varices with bleeding: Secondary | ICD-10-CM

## 2014-01-29 DIAGNOSIS — D62 Acute posthemorrhagic anemia: Secondary | ICD-10-CM

## 2014-01-29 DIAGNOSIS — K766 Portal hypertension: Secondary | ICD-10-CM

## 2014-01-29 DIAGNOSIS — B1920 Unspecified viral hepatitis C without hepatic coma: Secondary | ICD-10-CM | POA: Diagnosis present

## 2014-01-29 LAB — HEPATITIS B SURFACE ANTIGEN: Hepatitis B Surface Ag: NEGATIVE

## 2014-01-29 LAB — COMPREHENSIVE METABOLIC PANEL
ALK PHOS: 81 U/L (ref 39–117)
ALT: 104 U/L — AB (ref 0–53)
AST: 115 U/L — AB (ref 0–37)
Albumin: 2.1 g/dL — ABNORMAL LOW (ref 3.5–5.2)
BILIRUBIN TOTAL: 1.6 mg/dL — AB (ref 0.3–1.2)
BUN: 38 mg/dL — ABNORMAL HIGH (ref 6–23)
CO2: 20 meq/L (ref 19–32)
Calcium: 7.4 mg/dL — ABNORMAL LOW (ref 8.4–10.5)
Chloride: 108 mEq/L (ref 96–112)
Creatinine, Ser: 1.04 mg/dL (ref 0.50–1.35)
GFR calc Af Amer: 89 mL/min — ABNORMAL LOW (ref >90)
GFR, EST NON AFRICAN AMERICAN: 77 mL/min — AB (ref >90)
GLUCOSE: 92 mg/dL (ref 70–99)
POTASSIUM: 4.4 meq/L (ref 3.7–5.3)
Sodium: 137 mEq/L (ref 137–147)
Total Protein: 5 g/dL — ABNORMAL LOW (ref 6.0–8.3)

## 2014-01-29 LAB — CBC
HCT: 23.7 % — ABNORMAL LOW (ref 39.0–52.0)
HEMATOCRIT: 24.5 % — AB (ref 39.0–52.0)
HEMATOCRIT: 24.1 % — AB (ref 39.0–52.0)
HEMOGLOBIN: 8.2 g/dL — AB (ref 13.0–17.0)
HEMOGLOBIN: 8.5 g/dL — AB (ref 13.0–17.0)
HEMOGLOBIN: 8.3 g/dL — AB (ref 13.0–17.0)
MCH: 31.9 pg (ref 26.0–34.0)
MCH: 32.4 pg (ref 26.0–34.0)
MCH: 32 pg (ref 26.0–34.0)
MCHC: 34.6 g/dL (ref 30.0–36.0)
MCHC: 34.7 g/dL (ref 30.0–36.0)
MCHC: 34.4 g/dL (ref 30.0–36.0)
MCV: 92.2 fL (ref 78.0–100.0)
MCV: 93.5 fL (ref 78.0–100.0)
MCV: 93.1 fL (ref 78.0–100.0)
Platelets: 87 10*3/uL — ABNORMAL LOW (ref 150–400)
Platelets: 84 10*3/uL — ABNORMAL LOW (ref 150–400)
Platelets: 82 10*3/uL — ABNORMAL LOW (ref 150–400)
RBC: 2.57 MIL/uL — ABNORMAL LOW (ref 4.22–5.81)
RBC: 2.62 MIL/uL — AB (ref 4.22–5.81)
RBC: 2.59 MIL/uL — ABNORMAL LOW (ref 4.22–5.81)
RDW: 18.1 % — ABNORMAL HIGH (ref 11.5–15.5)
RDW: 19 % — ABNORMAL HIGH (ref 11.5–15.5)
RDW: 18.7 % — ABNORMAL HIGH (ref 11.5–15.5)
WBC: 15.8 10*3/uL — ABNORMAL HIGH (ref 4.0–10.5)
WBC: 12.5 10*3/uL — AB (ref 4.0–10.5)
WBC: 10.7 10*3/uL — ABNORMAL HIGH (ref 4.0–10.5)

## 2014-01-29 LAB — HEPATITIS PANEL, ACUTE
HCV Ab: REACTIVE — AB
HEP B C IGM: NONREACTIVE
Hep A IgM: NONREACTIVE
Hepatitis B Surface Ag: NEGATIVE

## 2014-01-29 LAB — TROPONIN I: Troponin I: <0.3 ng/mL (ref <0.30)

## 2014-01-29 LAB — HEPATITIS B SURFACE ANTIBODY,QUALITATIVE: Hep B S Ab: NEGATIVE

## 2014-01-29 LAB — AFP TUMOR MARKER: AFP-Tumor Marker: 12.3 ng/mL — ABNORMAL HIGH (ref 0.0–8.0)

## 2014-01-29 LAB — HIV ANTIBODY (ROUTINE TESTING W REFLEX): HIV 1&2 Ab, 4th Generation: NONREACTIVE

## 2014-01-29 LAB — HEPATITIS C ANTIBODY: HCV Ab: REACTIVE — AB

## 2014-01-29 MED ORDER — NICOTINE 14 MG/24HR TD PT24
14.0000 mg | MEDICATED_PATCH | Freq: Every day | TRANSDERMAL | Status: DC
  Administered 2014-01-29 – 2014-02-01 (×4): 14 mg via TRANSDERMAL
  Filled 2014-01-29 (×4): qty 1

## 2014-01-29 NOTE — Progress Notes (Signed)
Subjective: Patient doing well this morning. His abd pain is much improved. No more N/V. No bleeding overnight. He denies lightheadedness, CP, SOB, palpitations. 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 Pt is now aware of his new diagnosis of hepatitis C.    Objective: Vital signs in last 24 hours: Filed Vitals:   01/29/14 0200 01/29/14 0300 01/29/14 0400 01/29/14 0500  BP: 91/58 102/66 87/42 104/61  Pulse: 89 87 82 88  Temp:   98.2 F (36.8 C)   TempSrc:   Oral   Resp: 17 18 17 20   Height:      Weight:   204 lb 5.9 oz (92.7 kg)   SpO2: 93% 92% 90% 93%   Weight change:   Intake/Output Summary (Last 24 hours) at 01/29/14 0713 Last data filed at 01/28/14 2300  Gross per 24 hour  Intake 1988.83 ml  Output    700 ml  Net 1288.83 ml   Physical Exam: General: NAD, alert, resting in bed, Macy in pace w/ 2LPM  HEENT: NCAT, vision grossly intact Neck: supple Cardiac: RRR, no m//g/r Pulm: CTAB Abd: soft, no TTP, mildly distended, +BS, no fluid wave Ext: warm and well perfused, no pedal edema  Neuro: alert and oriented X3, cranial nerves II-XII grossly intact, moving all extremities spontaneously  Lab Results: Basic Metabolic Panel:  Recent Labs Lab 01/28/14 1245 01/29/14 0252  NA 138 137  K 5.7* 4.4  CL 105 108  CO2 17* 20  GLUCOSE 104* 92  BUN 48* 38*  CREATININE 1.10 1.04  CALCIUM 7.8* 7.4*   Liver Function Tests:  Recent Labs Lab 01/28/14 0821 01/29/14 0252  AST 166* 115*  ALT 139* 104*  ALKPHOS 105 81  BILITOT 2.6* 1.6*  PROT 5.9* 5.0*  ALBUMIN 2.6* 2.1*    Recent Labs Lab 01/28/14 0821  LIPASE 40    Recent Labs Lab 01/28/14 0816  AMMONIA 38   CBC:  Recent Labs Lab 01/28/14 1245 01/29/14 0252  WBC 18.2* 15.8*  HGB 7.4* 8.2*  HCT 20.9* 23.7*  MCV 96.3 92.2  PLT 127* 87*   Cardiac Enzymes:  Recent Labs Lab 01/28/14 0821  01/28/14 1245  TROPONINI <0.30 <0.30   Coagulation:  Recent Labs Lab 01/28/14 0821  LABPROT 18.4*  INR 1.58*   Micro Results: Recent Results (from the past 240 hour(s))  MRSA PCR SCREENING     Status: None   Collection Time    01/28/14 11:14 AM      Result Value Ref Range Status   MRSA by PCR NEGATIVE  NEGATIVE Final   Comment:            The GeneXpert MRSA Assay (FDA     approved for NASAL specimens     only), is one component of a     comprehensive MRSA colonization     surveillance program. It is not     intended to diagnose MRSA     infection nor to guide or     monitor treatment for     MRSA infections.   Studies/Results: Ct Abdomen Pelvis W Contrast  01/28/2014   CLINICAL DATA:  Throwing up blood, lower abdominal pain  EXAM: CT ABDOMEN AND PELVIS WITH CONTRAST  TECHNIQUE: Multidetector CT imaging of the abdomen and pelvis was performed using the standard protocol following bolus administration of intravenous contrast.  CONTRAST:  114mL OMNIPAQUE IOHEXOL 300 MG/ML  SOLN  COMPARISON:  None.  FINDINGS: There is bilateral mild chronic interstitial disease, right greater than left. There are esophageal varices noted. There is coronary artery atherosclerosis involving the left main, LAD, circumflex and right coronary artery.  The liver is diminutive in size with a micronodular contour more consistent with cirrhosis. There is no intrahepatic or extrahepatic biliary ductal dilatation. There is severe gallbladder wall thickening. There is a small amount air within the gallbladder. There is mild splenomegaly. The kidneys, adrenal glands and pancreas are normal. The bladder is unremarkable.  There is no bowel dilatation to suggest obstruction. There is a small amount of abdominal ascites. There is bowel wall thickening involving the small bowel and colon as can be seen with a hypoalbuminemic and cirrhotic state. There is no pneumoperitoneum, pneumatosis, or portal venous gas. There is no  abdominal or pelvic free fluid. There is no lymphadenopathy.  The abdominal aorta is normal in caliber with atherosclerosis.  There are no lytic or sclerotic osseous lesions. There is degenerative disc disease at L3-4, L4-5 and L5-S1.  IMPRESSION: 1. Cirrhotic liver.  No hyperenhancing mass to suggest a hepatoma. 2. Small amount of abdominal ascites with bowel wall thickening involving the small bowel and colon as can be seen in the setting of hypoalbuminemia and cirrhosis versus enterocolitis. 3. Gallbladder wall thickening likely related to hepatocellular disease. 4. Small amount of air within the gallbladder which may be secondary to recent instrumentation. In the absence of recent instrumentation the appearance is concerning for infection. 5. Mild splenomegaly and esophageal varices.   Electronically Signed   By: Kathreen Devoid   On: 01/28/2014 22:48   Dg Chest Portable 1 View  01/28/2014   CLINICAL DATA:  GI bleed  EXAM: PORTABLE CHEST - 1 VIEW  COMPARISON:  None.  FINDINGS: Mild/vague right lower lobe opacity is favored to reflect scarring/atelectasis. Lungs are otherwise clear. No pleural effusion or pneumothorax.  The heart is normal in size.  Enteric tube coursing into the stomach.  IMPRESSION: No evidence of acute cardiopulmonary disease.   Electronically Signed   By: Julian Hy M.D.   On: 01/28/2014 10:12   Medications: I have reviewed the patient's current medications. Scheduled Meds: . cefTRIAXone (ROCEPHIN)  IV  1 g Intravenous Q24H  . pneumococcal 23 valent vaccine  0.5 mL Intramuscular Tomorrow-1000  . sodium chloride  3 mL Intravenous Q12H   Continuous Infusions: . sodium chloride 1,000 mL (01/29/14 0218)  . pantoprozole (PROTONIX) infusion 8 mg/hr (01/29/14 0635)   PRN Meds:.morphine injection, ondansetron (ZOFRAN) IV, ondansetron  Assessment/Plan:  UGI bleed w/ esophageal varices and portal HTN resulting in acute blood loss anemia- Patient presented with hematemesis, melena  and +FOBT. Pt with no active bleeding overnight s/p EGD and banding (x7) of esophageal varices yesterday afternoon by Dr. Benson [place]. Patient's blood pressure somewhat soft at admission and overnight was 87-106/50-60s, though now stable in low 100s/60s. Hb decreased from 8.5 on admission to 7.4 yesterday afternoon. Received 2U pRBCs w/ posttransfusion Hb 8.2 and repeat Hb this AM 8.4. Lactate elevated to 4.13 on admission with elevated AG to 16, thought to be 2/2 hypotension. AG improved this morning at 9. He received 2L NS in ED, 2U pRBCs yesterday evening, and kept on NS at 150cc/hr overnight. Given patient's esophageal varices and portal HTN on EGD, CT abd/pelvis was performed which showed cirrhosis, no masses. There was also some air in the gall bladder indicative of possible infection (though asymptomatic?).  Pt found to be Hep C positive, will do genotyping today. HIV nonreactive. Hep B negative. Of note, troponin neg x 3. Lipase and ammonia wnl on admission. LFTs down trending, ALT 139-->104, AST 166-->115. INR elevated at 1.58. -continue IVF at 150cc/hr as BP remains somewhat soft -transfer to telemetry bed - CBC q8h (Hb goal >8) - continue protonix gtt - ceftriaxone 1g IV daily  -asked GI about air in gall bladder, they do not think this represents infection and patient is symptomatic, so do not advise doing anything further at this time -advance diet as tolerated -morphine, zofran prn   Elevated transaminase level in the setting of newly diagnosed Hepatitis C - Elevated AST and ALT in the setting of hypotension and active GI bleed is likely from the transient hypotensive as mild liver injury. Now with known Hep C, so LFTs may be chronically elevated. LFTs down trending, ALT 139-->104, AST 166-->115. Hep B negative. -CMP in the AM   Hyperkalemia - Patient had K drawn in ED which was 6.0, bicarb and calcium gluconate given in the ED on admission. No EKG changes. Repeat K 5.7-->4.4 this AM.   Tobacco  abuse - nicotine patch  Diet- advance as tolerated- pt requesting to stay on CLD this AM  DVT ppx - SCDs given active GI bleeding  Dispo: Disposition is deferred at this time, awaiting improvement of current medical problems.  Anticipated discharge in approximately 2-3 day(s).   The patient does not have a current PCP (No primary provider on file.) and does need an [facility] hospital follow-up appointment after discharge.  The patient does not have transportation limitations that hinder transportation to clinic appointments.  .Services Needed at time of discharge: Y = Yes, Blank = No PT:   OT:   RN:   Equipment:   Other:     LOS: 1 day   Rebecca Eaton, MD 01/29/2014, 7:13 AM

## 2014-01-29 NOTE — Progress Notes (Signed)
   I have seen the patient and reviewed the daily progress note by Durene Fruits MS3 and discussed the care of the patient with them.  See my note for documentation of my findings, assessment, and plans.

## 2014-01-29 NOTE — Progress Notes (Signed)
George Fuller [id number]  Transfer Data: 01/29/2014 1:08 PM  Attending Provider: Madilyn Fireman, MD  PCP:No primary provider on file.  Code Status: Full  George Fuller is a 58 y.o. male patient transferred from 2c  -No acute distress noted.  -No complaints of shortness of breath.  -No complaints of chest pain.  Cardiac Monitoring:  Box # 20 in place.  Cardiac monitor yields:normal sinus rhythm.  Blood pressure 100/62, pulse 80, temperature 98.2 F (36.8 C), temperature source Oral, resp. rate 15, height 5\' 11"  (1.803 m), weight 97.75 kg (215 lb 8 oz), SpO2 91.00%.  ?  IV Fluids: IV in place, occlusive dsg intact without redness, IV cath antecubital right, condition patent and no redness  normal saline.  Allergies: Review of patient's allergies indicates no known allergies.  Past Medical History:  has no past medical history on file.  Past Surgical History:  has past surgical history that includes Esophagogastroduodenoscopy (N/A, 01/28/2014).  Social History:  reports that he has been smoking Cigarettes.  He has a 30 pack-year smoking history. He does not have any smokeless tobacco history on file. He reports that he does not drink alcohol or use illicit drugs.  Skin: intact   Patient/Family orientated to room. Information packet given to patient/family. Admission inpatient armband information verified with patient/family to include name and date of birth and placed on patient arm. Side rails up x 2, fall assessment and education completed with patient/family. Patient/family able to verbalize understanding of risk associated with falls and verbalized understanding to call for assistance before getting out of bed. Call light within reach. Patient/family able to voice and demonstrate understanding of unit orientation instructions.  Will continue to evaluate and treat per MD orders.

## 2014-01-29 NOTE — Progress Notes (Signed)
Subjective:  Mr. Lento has had no significant overnight events. He states that the epigastric pain which he was experiencing after his upper endoscopy yesterday has subsided. He was told about his new hepatitis C diagnosis and understands. He states that during the [place] war he engaged in IV drug use which was limited to that experience. He denies chest pain, sob, diarrhea or constipation.   Objective: Vital signs in last 24 hours: Filed Vitals:   01/29/14 0400 01/29/14 0500 01/29/14 0600 01/29/14 0742  BP: 87/42 104/61 106/58 109/50  Pulse: 82 88 98 81  Temp: 98.2 F (36.8 C)   98.4 F (36.9 C)  TempSrc: Oral   Oral  Resp: 17 20 13 18   Height:      Weight: 92.7 kg (204 lb 5.9 oz)     SpO2: 90% 93% 93% 93%   Weight change:   Intake/Output Summary (Last 24 hours) at 01/29/14 0911 Last data filed at 01/29/14 9937  Gross per 24 hour  Intake 1988.83 ml  Output   1150 ml  Net 838.83 ml   Physical Exam: General: Resting comfortably in bed in no acute distress HEENT: PEERL, MMM Neck: Supple, no lymphadenopathy Cardio: RRR, No M/R/G Pulm: Rhonchi heard in upper lungs bilaterally and left lower lobe, no wheezing heard bilaterally Abdominal: Soft, non-tender, normal abdominal bowel sounds Extremities: 2+ pedal and radial pulses, no edema Neuro: Alert and Oriented x3, CN III-XII intact  Lab Results: @LABTEST2 @ Micro Results: Recent Results (from the past 240 hour(s))  MRSA PCR SCREENING     Status: None   Collection Time    01/28/14 11:14 AM      Result Value Ref Range Status   MRSA by PCR NEGATIVE  NEGATIVE Final   Comment:            The GeneXpert MRSA Assay (FDA     approved for NASAL specimens     only), is one component of a     comprehensive MRSA colonization     surveillance program. It is not     intended to diagnose MRSA     infection nor to guide or     monitor treatment for     MRSA infections.   Studies/Results: Ct Abdomen Pelvis W Contrast  01/28/2014    CLINICAL DATA:  Throwing up blood, lower abdominal pain  EXAM: CT ABDOMEN AND PELVIS WITH CONTRAST  TECHNIQUE: Multidetector CT imaging of the abdomen and pelvis was performed using the standard protocol following bolus administration of intravenous contrast.  CONTRAST:  180mL OMNIPAQUE IOHEXOL 300 MG/ML  SOLN  COMPARISON:  None.  FINDINGS: There is bilateral mild chronic interstitial disease, right greater than left. There are esophageal varices noted. There is coronary artery atherosclerosis involving the left main, LAD, circumflex and right coronary artery.  The liver is diminutive in size with a micronodular contour more consistent with cirrhosis. There is no intrahepatic or extrahepatic biliary ductal dilatation. There is severe gallbladder wall thickening. There is a small amount air within the gallbladder. There is mild splenomegaly. The kidneys, adrenal glands and pancreas are normal. The bladder is unremarkable.  There is no bowel dilatation to suggest obstruction. There is a small amount of abdominal ascites. There is bowel wall thickening involving the small bowel and colon as can be seen with a hypoalbuminemic and cirrhotic state. There is no pneumoperitoneum, pneumatosis, or portal venous gas. There is no abdominal or pelvic free fluid. There is no lymphadenopathy.  The abdominal aorta is normal  in caliber with atherosclerosis.  There are no lytic or sclerotic osseous lesions. There is degenerative disc disease at L3-4, L4-5 and L5-S1.  IMPRESSION: 1. Cirrhotic liver.  No hyperenhancing mass to suggest a hepatoma. 2. Small amount of abdominal ascites with bowel wall thickening involving the small bowel and colon as can be seen in the setting of hypoalbuminemia and cirrhosis versus enterocolitis. 3. Gallbladder wall thickening likely related to hepatocellular disease. 4. Small amount of air within the gallbladder which may be secondary to recent instrumentation. In the absence of recent instrumentation  the appearance is concerning for infection. 5. Mild splenomegaly and esophageal varices.   Electronically Signed   By: Kathreen Devoid   On: 01/28/2014 22:48   Dg Chest Portable 1 View  01/28/2014   CLINICAL DATA:  GI bleed  EXAM: PORTABLE CHEST - 1 VIEW  COMPARISON:  None.  FINDINGS: Mild/vague right lower lobe opacity is favored to reflect scarring/atelectasis. Lungs are otherwise clear. No pleural effusion or pneumothorax.  The heart is normal in size.  Enteric tube coursing into the stomach.  IMPRESSION: No evidence of acute cardiopulmonary disease.   Electronically Signed   By: Julian Hy M.D.   On: 01/28/2014 10:12   Medications:  Scheduled: . cefTRIAXone (ROCEPHIN)  IV  1 g Intravenous Q24H  . nicotine  14 mg Transdermal Daily  . pneumococcal 23 valent vaccine  0.5 mL Intramuscular Tomorrow-1000  . sodium chloride  3 mL Intravenous Q12H   Continuous: . sodium chloride 150 mL/hr at 01/29/14 0952  . pantoprozole (PROTONIX) infusion 8 mg/hr (01/29/14 0800)   EGB:TDVVOHYW injection, ondansetron (ZOFRAN) IV, ondansetron Scheduled Meds: . cefTRIAXone (ROCEPHIN)  IV  1 g Intravenous Q24H  . nicotine  14 mg Transdermal Daily  . pneumococcal 23 valent vaccine  0.5 mL Intramuscular Tomorrow-1000  . sodium chloride  3 mL Intravenous Q12H   Continuous Infusions: . sodium chloride 1,000 mL (01/29/14 0218)  . pantoprozole (PROTONIX) infusion 8 mg/hr (01/29/14 0635)   PRN Meds:.morphine injection, ondansetron (ZOFRAN) IV, ondansetron  Assessment/Plan: Mr. Vana is a 58yo man who has no significant past medical history presented with a 2 day history of vomiting blood with tarry black stools found to have cirrhosis and chronic hepatitis C.  Principal Problem:   GI bleed Active Problems:   Tobacco abuse   Hyperkalemia   Elevated transaminase level   Hypotension  Acute GI bleed - Patient was having an upper GI bleed, with hematemasis and melena. GI consulted in and performed a upper  endoscopy finding esophageal varices and cirrhosis.Banding of 7 varices was performed. Hg 8.5 dropped to 7.4 and was given 2 uPRBs with rise to Hg >8. Found to have hepatitis C on Hepatitis panel. HIV non-reactive. Negative troponins - CBC q 8 hours  - Move to Telemetry  - Protonix drip  - Trend CBC and Bmet - Continue Ceftriaxone - Morphine PRN for pain - Advance diet at dolerated  #Hepatitis C- Found to have a positive hepatitis C after admission. Most likely from previous IV drug use during the [place] war.  - Obtain hepatitis C viral load - Obtain HCV genotype - Set up follow up with hepatitis C clinic - Social work for [employer] and setting up with the [place].    This is a Careers information officer Note.  The care of the patient was discussed with Dr. Mechele Claude and the assessment and plan formulated with their assistance.  Please see their attached note for official documentation of the daily  encounter.   LOS: 1 day   Durene Fruits, Med Student 01/29/2014, 9:11 AM

## 2014-01-29 NOTE — H&P (Signed)
INTERNAL MEDICINE TEACHING ATTENDING NOTE  Day 1 of stay  Patient name: George Fuller  MRN: [id number] Date of birth: [birth year]   58 y.o.man with no prior medical contact admitted with hematemesis and dark tarry stools, noted to have acute blood loss anemia, seen by gastroenterology and found to have esophageal varices on endoscopy which have been banded. On further work up he is also found to have HCV and cirrhosis. He has responded well to the procedure as well as 2 units of blood transfusion.   I met and examined him this morning and he reported feeling some chest pressure post procedure, however otherwise feels much better. His vitals have been stable after the initial hypotension that he had. His cardiac exam is remarkable for loud S1 with ESM grade 3/6 His abdomen is soft and non tender with no distension or fluid thrill. Lungs are clear to auscultation. He does not have pedal edema. Skin does not bear stigmata of cirrhosis as much as I could see.   Appreciate GI input. Agree with ceftriaxone, monitor hemoglobin further. Work for HCV cirrhosis has been started by GI. We will move the patient out of stepdown.         I have seen and evaluated this patient and discussed it with my IM resident team.  Please see the rest of the plan per resident note from today.   Niambi Smoak 01/29/2014, 11:03 AM.

## 2014-01-29 NOTE — Progress Notes (Signed)
Report called to [place], Therapist, sports.  Updated on patient history, current status and plan of care. Patient is stable and able to transfer at this time.

## 2014-01-29 NOTE — Discharge Summary (Signed)
Name: George Fuller MRN: [id number] DOB: September 30, 1956 58 y.o. PCP: No primary provider on file.  Date of Admission: 01/28/2014  8:00 AM Date of Discharge: 02/01/2014 Attending Physician: Madilyn Fireman, MD  Discharge Diagnosis: Principal Problem:   GI bleed- from esophageal varices s/p banding  Active Problems:   Tobacco abuse   Hyperkalemia   Elevated transaminase level   Hypotension   Hepatitis C w/ cirrhosis; newly diagnosed this admission   C. difficile diarrhea- discharged with PO vanc   Acute blood loss anemia  Discharge Medications:   Medication List    STOP taking these medications       aspirin 325 MG tablet     MAGNESIUM-ZINC PO     POTASSIMIN PO      TAKE these medications       vancomycin 50 mg/mL oral solution  Commonly known as:  VANCOCIN  Take 2.5 mLs (125 mg total) by mouth 4 (four) times daily.        Disposition and follow-up:   Mr.George Fuller was discharged from [facility] in Stable condition.  At the hospital follow up visit please address:  1.  UGI bleed- recheck CBC and assess for s/s of bleeding 2.  Patient came in on potassium and magnesium supplements, though was hyperkalemic on admission so these were stopped, please recheck K and Mg level and assess if patient does need these supplements 3. Did patient apply for medicaid in order to assist with Hep C treatment? 4.  C diff diarrhea- please assess compliance w/ vancomycin PO and for symptom resolution  2.  Labs / imaging needed at time of follow-up: CBC/CMP  3.  Pending labs/ test needing follow-up: hep C genotype   Follow-up Appointments: Follow-up Information   Follow up with [facility]    . Schedule an appointment as soon as possible for a visit in 1 week. (you can walk in to this clinic or call and make an appointment)    Contact information:   [place] [phone]      Follow up with  Beryle Beams, MD On 02/15/2014. (10:30am)    Specialty:  Gastroenterology   Contact information:   [address] [place] [address] [phone]      Consultations: Treatment Team:  Beryle Beams, MD (gastroenterology)  Procedures Performed:  Ct Abdomen Pelvis W Contrast  01/28/2014   CLINICAL DATA:  Throwing up blood, lower abdominal pain  EXAM: CT ABDOMEN AND PELVIS WITH CONTRAST  TECHNIQUE: Multidetector CT imaging of the abdomen and pelvis was performed using the standard protocol following bolus administration of intravenous contrast.  CONTRAST:  14mL OMNIPAQUE IOHEXOL 300 MG/ML  SOLN  COMPARISON:  None.  FINDINGS: There is bilateral mild chronic interstitial disease, right greater than left. There are esophageal varices noted. There is coronary artery atherosclerosis involving the left main, LAD, circumflex and right coronary artery.  The liver is diminutive in size with a micronodular contour more consistent with cirrhosis. There is no intrahepatic or extrahepatic biliary ductal dilatation. There is severe gallbladder wall thickening. There is a small amount air within the gallbladder. There is mild splenomegaly. The kidneys, adrenal glands and pancreas are normal. The bladder is unremarkable.  There is no bowel dilatation to suggest obstruction. There is a small amount of abdominal ascites. There is bowel wall thickening involving the small bowel and colon as can be seen with a hypoalbuminemic and cirrhotic state. There is  no pneumoperitoneum, pneumatosis, or portal venous gas. There is no abdominal or pelvic free fluid. There is no lymphadenopathy.  The abdominal aorta is normal in caliber with atherosclerosis.  There are no lytic or sclerotic osseous lesions. There is degenerative disc disease at L3-4, L4-5 and L5-S1.  IMPRESSION: 1. Cirrhotic liver.  No hyperenhancing mass to suggest a hepatoma. 2. Small amount of abdominal ascites with bowel wall thickening involving the  small bowel and colon as can be seen in the setting of hypoalbuminemia and cirrhosis versus enterocolitis. 3. Gallbladder wall thickening likely related to hepatocellular disease. 4. Small amount of air within the gallbladder which may be secondary to recent instrumentation. In the absence of recent instrumentation the appearance is concerning for infection. 5. Mild splenomegaly and esophageal varices.   Electronically Signed   By: Kathreen Devoid   On: 01/28/2014 22:48   Dg Chest Portable 1 View  01/28/2014   CLINICAL DATA:  GI bleed  EXAM: PORTABLE CHEST - 1 VIEW  COMPARISON:  None.  FINDINGS: Mild/vague right lower lobe opacity is favored to reflect scarring/atelectasis. Lungs are otherwise clear. No pleural effusion or pneumothorax.  The heart is normal in size.  Enteric tube coursing into the stomach.  IMPRESSION: No evidence of acute cardiopulmonary disease.   Electronically Signed   By: Julian Hy M.D.   On: 01/28/2014 10:12   Admission HPI:  The patient is a 58 YO man who has no significant past medical history who is presenting with a 2 day history of vomiting blood with tarry black stools. He states that it started around 4:30 AM 2 days ago with some stomach discomfort and vomiting bloody material. This continued with some dark tarry stools intermittently over the next day or so. The vomiting continued as well intermittently with blood. The vomiting became more consistent and with clots so he presented to the ED. He was feeling weak the morning of admission but denied lightheadedness or dizziness. He did not pass out. He denies any chest pain or tightness. He denies history of GERD. He denies heavy alcohol usage and none in the past. He is an occasional social drinker. He has been taking about 2 aspirin daily for the last 10 days for some wrist pain. He denies any history of cardiac or breathing problems. He does not have a doctor and has not seen one in some time. His wife is with him and  confirms his story. He does not use drugs although he does smoke 1 ppd for 30 years or so. He denies current abdominal pain although he is having some nausea. No fevers or chills at home. No dysuria or hematuria. He has not had colonoscopy in the past. Patient specifically declines usage of BC powder, goody powder, ibuprofen, naproxen, advil, motrin.   Hospital Course by problem list:  UGI bleed 2/2 esophageal varices with acute blood loss anemia: Patient presented with hematemesis, melena and +FOBT. Pt hypotensive to low 100s/50s, though the rest of VSS. Pt received 2L NS in ED w/ NS at 150cc/hr for entire hospitalization. Hb on admission 8.5. GI consulted in ED. Protonix drip and IV ceftriaxone initiated and EGD performed. EGD 4/16 showed multiple esophageal varices, banding (x7) of esophageal varices was done by Dr. Benson [place]. After banding, patient overall did well with resolution of melena and hematemasis. CT abd/pelvis was performed which showed cirrhosis, no masses.  Hb trended down to 7.4 on first night of hospital stay for which he received 2u pRBCs. Hb relatively stable  thereafter, though did trend down to 7.9 on HD 3 so patient received another 1u pRBCs on day prior to discharge. Hb remained stable and was 9.4 on morning of discharge. After patient developed C diff diarrhea (see below), the ceftriaxone was stopped. Pt found to be Hep C positive, genotyping pending. HIV nonreactive. Hep B negative. Of note, troponin neg x 3, and lipase and ammonia wnl on admission. Pt tolerating regular diet prior to his discharge. Patient was scheduled for outpatient follow up with GI for 2 week after his discharge. He does not have a PCP and was given the contact information for a number of different PCPs that he and his wife will call for a follow up appointment.   Severe C.diff diarrhea: Pt with multiple episodes of watery diarrhea starting on 4/18. C.diff +. With WBC count >15 (WBC count 17), he was considered to  have "severe" C.diff infection, and po Vancomycin was started on evening of 4/18. He was discharged on Vancomycin po 125mg  QID x 13 more days, total course of 14 days.  Hepatitis C: Newly diagnosed this admission, prior to this patient had no idea of his hepatitis C or liver cirrhosis. He did endorse using IVDU only a few times during the [place] war. Elevated AST and ALT on admission thought to be in the setting of hypotension and active GI bleed and was transient as mild liver injury.However, newly diagnosed w/ Hep C, so LFTs may be chronically elevated. LFTs improved during admission, ALT 139-->104 --> 91-->77, AST 166-->115 -->98-->72. Hep B negative. Hep C genotype pending. Per Dr. Benson [place], he needs to apply for Medicaid which will provide coverage for the Hep C therapy (CSW helped patient and wife get started with this during the admission). Pt scheduled for f/u appointment w/ GI in 2 weeks.  Hyperkalemia - Patient found to have potassium of 6.0 upon admission. Pt given bicarb and calcium gluconate in ED. No EKG changes. Potassium supplements on his medication list but he denies taking (discontinued upon discharge). Patient's potassium down trended nicely and was 3.8 on morning of discharge. It is unclear why he had potassium supplements on his home medication list, so this may need to be readdressed as outpatient.  Tobacco abuse - Pt counseled on cessation. Received nicotine patch during hospitalization. He tells me that he plans to quit when he is discharged.  Discharge Vitals:   BP 112/68  Pulse 73  Temp(Src) 97.3 F (36.3 C) (Oral)  Resp 17  Ht 5\' 11"  (1.803 m)  Wt 215 lb 8 oz (97.75 kg)  BMI 30.07 kg/m2  SpO2 92%  Discharge Labs:  Results for orders placed during the hospital encounter of 01/28/14 (from the past 24 hour(s))  CBC     Status: Abnormal   Collection Time    01/31/14  6:30 PM      Result Value Ref Range   WBC 14.7 (*) 4.0 - 10.5 K/uL   RBC 3.17 (*) 4.22 - 5.81 MIL/uL    Hemoglobin 10.1 (*) 13.0 - 17.0 g/dL   HCT 29.9 (*) 39.0 - 52.0 %   MCV 94.3  78.0 - 100.0 fL   MCH 31.9  26.0 - 34.0 pg   MCHC 33.8  30.0 - 36.0 g/dL   RDW 19.4 (*) 11.5 - 15.5 %   Platelets 99 (*) 150 - 400 K/uL  COMPREHENSIVE METABOLIC PANEL     Status: Abnormal   Collection Time    02/01/14  5:17 AM  Result Value Ref Range   Sodium 137  137 - 147 mEq/L   Potassium 3.8  3.7 - 5.3 mEq/L   Chloride 108  96 - 112 mEq/L   CO2 21  19 - 32 mEq/L   Glucose, Bld 101 (*) 70 - 99 mg/dL   BUN 20  6 - 23 mg/dL   Creatinine, Ser 0.89  0.50 - 1.35 mg/dL   Calcium 7.3 (*) 8.4 - 10.5 mg/dL   Total Protein 5.6 (*) 6.0 - 8.3 g/dL   Albumin 2.3 (*) 3.5 - 5.2 g/dL   AST 72 (*) 0 - 37 U/L   ALT 77 (*) 0 - 53 U/L   Alkaline Phosphatase 105  39 - 117 U/L   Total Bilirubin 1.7 (*) 0.3 - 1.2 mg/dL   GFR calc non Af Amer >90  >90 mL/min   GFR calc Af Amer >90  >90 mL/min  CBC     Status: Abnormal   Collection Time    02/01/14  5:17 AM      Result Value Ref Range   WBC 8.0  4.0 - 10.5 K/uL   RBC 2.90 (*) 4.22 - 5.81 MIL/uL   Hemoglobin 9.4 (*) 13.0 - 17.0 g/dL   HCT 27.7 (*) 39.0 - 52.0 %   MCV 95.5  78.0 - 100.0 fL   MCH 32.4  26.0 - 34.0 pg   MCHC 33.9  30.0 - 36.0 g/dL   RDW 19.7 (*) 11.5 - 15.5 %   Platelets 67 (*) 150 - 400 K/uL    Signed: Rebecca Eaton, MD 02/01/2014, 10:33 AM   Time Spent on Discharge: 35 minutes Services Ordered on Discharge: none Equipment Ordered on Discharge: none

## 2014-01-29 NOTE — Clinical Documentation Improvement (Signed)
01/29/14  To Internal Medicine Md's, NP's, and PA's     Noted patient admitted with HGB 8.5/25.0 Hct  which dropped as low as 7.4/20.9 Hct within 24 hours, transfusion of 2 units PRBC documented in notes.  Hgb 8.2/ 23.7 hct after transfusion.  If appropriate please document possible clinical condition. Thank you   Possible Clinical Conditions?    Expected Acute Blood Loss Anemia  Acute Blood Loss Anemia  Acute on chronic blood loss anemia  Chronic blood loss anemia  Precipitous drop in Hematocrit  Other Condition  Cannot Clinically Determine   Risk Factors:  GI Bleed    Signs and Symptoms: hypotension   Transfusion: 2 units PRBC  IV fluids: IV NS 2 150 ml/hr Serial H&H monitoring : CBC q 8 hours    Thank You, Ree Kida ,RN Clinical Documentation Specialist:  [place] Information Management

## 2014-01-29 NOTE — Progress Notes (Signed)
Clinical Social Work Department BRIEF PSYCHOSOCIAL ASSESSMENT 01/29/2014  Patient:  George Fuller,George Fuller     Account Number:  [id number]     Admit date:  01/28/2014  Clinical Social Worker:  Freeman Caldron  Date/Time:  01/29/2014 08:52 AM  Referred by:  Physician  Date Referred:  01/29/2014 Referred for  Other - See comment   Other Referral:   Medicaid app   Interview type:  Other - See comment Other interview type:   None    PSYCHOSOCIAL DATA Living Status:  FAMILY Admitted from facility:   Level of care:   Primary support name:  George Fuller ([phone]) Primary support relationship to patient:  SPOUSE Degree of support available:   Unassessed    CURRENT CONCERNS Current Concerns  Financial Resources   Other Concerns:    SOCIAL WORK ASSESSMENT / PLAN Received consult pt needs Medicaid application completed. Called financial counselor and left voicemail asking them to follow up.   Assessment/plan status:  No Further Intervention Required Other assessment/ plan:   Information/referral to community resources:   Development worker, community notified in voicemail message    PATIENT'S/FAMILY'S RESPONSE TO PLAN OF CARE: N/A       Ky Barban, MSW, [place] Social Worker [phone]

## 2014-01-29 NOTE — Progress Notes (Signed)
Subjective: No complaints.  Objective: Vital signs in last 24 hours: Temp:  [97.5 F (36.4 C)-98.5 F (36.9 C)] 98.4 F (36.9 C) (04/17 0742) Pulse Rate:  [81-131] 81 (04/17 0742) Resp:  [16-28] 18 (04/17 0742) BP: (87-142)/(42-108) 109/50 mmHg (04/17 0742) SpO2:  [83 %-100 %] 93 % (04/17 0742) Weight:  [200 lb (90.719 kg)-217 lb 9.5 oz (98.7 kg)] 204 lb 5.9 oz (92.7 kg) (04/17 0400)    Intake/Output from previous day: 04/16 0701 - 04/17 0700 In: 1988.8 [I.V.:1235.5; Blood:753.3] Out: 700 [Urine:700] Intake/Output this shift:    General appearance: alert and no distress GI: soft, non-tender; bowel sounds normal; no masses,  no organomegaly  Lab Results:  Recent Labs  01/28/14 0821 01/28/14 0831 01/28/14 1245 01/29/14 0252  WBC 23.3*  --  18.2* 15.8*  HGB 8.5* 9.5* 7.4* 8.2*  HCT 25.0* 28.0* 20.9* 23.7*  PLT 182  --  127* 87*   BMET  Recent Labs  01/28/14 0821 01/28/14 0831 01/28/14 1245 01/29/14 0252  NA 137 138 138 137  K 6.0* 5.6* 5.7* 4.4  CL 102 105 105 108  CO2 19  --  17* 20  GLUCOSE 109* 103* 104* 92  BUN 51* 47* 48* 38*  CREATININE 1.27 1.30 1.10 1.04  CALCIUM 8.2*  --  7.8* 7.4*   LFT  Recent Labs  01/29/14 0252  PROT 5.0*  ALBUMIN 2.1*  AST 115*  ALT 104*  ALKPHOS 81  BILITOT 1.6*   PT/INR  Recent Labs  01/28/14 0821  LABPROT 18.4*  INR 1.58*   Hepatitis Panel  Recent Labs  01/28/14 1245  HEPBSAG NEGATIVE  HCVAB Reactive*  HEPAIGM NON REACTIVE  HEPBIGM NON REACTIVE   C-Diff No results found for this basename: CDIFFTOX,  in the last 72 hours Fecal Lactopherrin No results found for this basename: FECLLACTOFRN,  in the last 72 hours  Studies/Results: Ct Abdomen Pelvis W Contrast  01/28/2014   CLINICAL DATA:  Throwing up blood, lower abdominal pain  EXAM: CT ABDOMEN AND PELVIS WITH CONTRAST  TECHNIQUE: Multidetector CT imaging of the abdomen and pelvis was performed using the standard protocol following bolus  administration of intravenous contrast.  CONTRAST:  124mL OMNIPAQUE IOHEXOL 300 MG/ML  SOLN  COMPARISON:  None.  FINDINGS: There is bilateral mild chronic interstitial disease, right greater than left. There are esophageal varices noted. There is coronary artery atherosclerosis involving the left main, LAD, circumflex and right coronary artery.  The liver is diminutive in size with a micronodular contour more consistent with cirrhosis. There is no intrahepatic or extrahepatic biliary ductal dilatation. There is severe gallbladder wall thickening. There is a small amount air within the gallbladder. There is mild splenomegaly. The kidneys, adrenal glands and pancreas are normal. The bladder is unremarkable.  There is no bowel dilatation to suggest obstruction. There is a small amount of abdominal ascites. There is bowel wall thickening involving the small bowel and colon as can be seen with a hypoalbuminemic and cirrhotic state. There is no pneumoperitoneum, pneumatosis, or portal venous gas. There is no abdominal or pelvic free fluid. There is no lymphadenopathy.  The abdominal aorta is normal in caliber with atherosclerosis.  There are no lytic or sclerotic osseous lesions. There is degenerative disc disease at L3-4, L4-5 and L5-S1.  IMPRESSION: 1. Cirrhotic liver.  No hyperenhancing mass to suggest a hepatoma. 2. Small amount of abdominal ascites with bowel wall thickening involving the small bowel and colon as can be seen in the setting of  hypoalbuminemia and cirrhosis versus enterocolitis. 3. Gallbladder wall thickening likely related to hepatocellular disease. 4. Small amount of air within the gallbladder which may be secondary to recent instrumentation. In the absence of recent instrumentation the appearance is concerning for infection. 5. Mild splenomegaly and esophageal varices.   Electronically Signed   By: Kathreen Devoid   On: 01/28/2014 22:48   Dg Chest Portable 1 View  01/28/2014   CLINICAL DATA:  GI  bleed  EXAM: PORTABLE CHEST - 1 VIEW  COMPARISON:  None.  FINDINGS: Mild/vague right lower lobe opacity is favored to reflect scarring/atelectasis. Lungs are otherwise clear. No pleural effusion or pneumothorax.  The heart is normal in size.  Enteric tube coursing into the stomach.  IMPRESSION: No evidence of acute cardiopulmonary disease.   Electronically Signed   By: Julian Hy M.D.   On: 01/28/2014 10:12    Medications:  Scheduled: . cefTRIAXone (ROCEPHIN)  IV  1 g Intravenous Q24H  . pneumococcal 23 valent vaccine  0.5 mL Intramuscular Tomorrow-1000  . sodium chloride  3 mL Intravenous Q12H   Continuous: . sodium chloride 1,000 mL (01/29/14 0218)  . pantoprozole (PROTONIX) infusion 8 mg/hr (01/29/14 5732)    Assessment/Plan: 1) Esophageal variceal bleed s/p banding. 2) HCV cirrhosis.   The HCV antibody is positive.  I will check his genotype.  Unfortunately the hospital does not offer HCV RNA quantitative blood work.  This can be obtained as an outpatient.  He is still at high risk for rebleeding and any acute decompensation from his cirrhosis.  He needs to be monitored for a few more days.    Plan: 1) Continue with ceftriaxone. 2) Monitor HGB. 3) Check HCV genotype. 4) Okay to transfer to [facility]. 5) Social work consult to initiate Medicaid enrollment.  I am anticipating on treating him for HCV.  LOS: 1 day   Beryle Beams 01/29/2014, 7:50 AM

## 2014-01-30 LAB — CBC
HCT: 23.3 % — ABNORMAL LOW (ref 39.0–52.0)
HEMATOCRIT: 23.1 % — AB (ref 39.0–52.0)
HEMATOCRIT: 23.6 % — AB (ref 39.0–52.0)
HEMOGLOBIN: 8 g/dL — AB (ref 13.0–17.0)
HEMOGLOBIN: 8.1 g/dL — AB (ref 13.0–17.0)
Hemoglobin: 8.1 g/dL — ABNORMAL LOW (ref 13.0–17.0)
MCH: 32.5 pg (ref 26.0–34.0)
MCH: 32.4 pg (ref 26.0–34.0)
MCH: 32.4 pg (ref 26.0–34.0)
MCHC: 34.3 g/dL (ref 30.0–36.0)
MCHC: 34.6 g/dL (ref 30.0–36.0)
MCHC: 34.8 g/dL (ref 30.0–36.0)
MCV: 93.2 fL (ref 78.0–100.0)
MCV: 93.9 fL (ref 78.0–100.0)
MCV: 94.4 fL (ref 78.0–100.0)
Platelets: 65 10*3/uL — ABNORMAL LOW (ref 150–400)
Platelets: 71 10*3/uL — ABNORMAL LOW (ref 150–400)
Platelets: 76 10*3/uL — ABNORMAL LOW (ref 150–400)
RBC: 2.46 MIL/uL — AB (ref 4.22–5.81)
RBC: 2.5 MIL/uL — ABNORMAL LOW (ref 4.22–5.81)
RBC: 2.5 MIL/uL — ABNORMAL LOW (ref 4.22–5.81)
RDW: 18.4 % — ABNORMAL HIGH (ref 11.5–15.5)
RDW: 18.7 % — AB (ref 11.5–15.5)
RDW: 18.1 % — ABNORMAL HIGH (ref 11.5–15.5)
WBC: 10.8 10*3/uL — ABNORMAL HIGH (ref 4.0–10.5)
WBC: 13.1 10*3/uL — AB (ref 4.0–10.5)
WBC: 17.3 10*3/uL — ABNORMAL HIGH (ref 4.0–10.5)

## 2014-01-30 LAB — COMPREHENSIVE METABOLIC PANEL
ALBUMIN: 2.1 g/dL — AB (ref 3.5–5.2)
ALT: 91 U/L — ABNORMAL HIGH (ref 0–53)
AST: 98 U/L — ABNORMAL HIGH (ref 0–37)
Alkaline Phosphatase: 83 U/L (ref 39–117)
BILIRUBIN TOTAL: 1.1 mg/dL (ref 0.3–1.2)
BUN: 25 mg/dL — AB (ref 6–23)
CALCIUM: 7.1 mg/dL — AB (ref 8.4–10.5)
CO2: 20 mEq/L (ref 19–32)
CREATININE: 0.95 mg/dL (ref 0.50–1.35)
Chloride: 113 mEq/L — ABNORMAL HIGH (ref 96–112)
GFR calc Af Amer: >90 mL/min (ref >90)
GFR calc non Af Amer: 89 mL/min — ABNORMAL LOW (ref >90)
Glucose, Bld: 93 mg/dL (ref 70–99)
Potassium: 4.2 mEq/L (ref 3.7–5.3)
Sodium: 140 mEq/L (ref 137–147)
TOTAL PROTEIN: 5 g/dL — AB (ref 6.0–8.3)

## 2014-01-30 LAB — CLOSTRIDIUM DIFFICILE BY PCR: Toxigenic C. Difficile by PCR: POSITIVE — AB

## 2014-01-30 MED ORDER — PROMETHAZINE HCL 25 MG/ML IJ SOLN: 12.5000 mg | INTRAMUSCULAR | Status: DC | PRN

## 2014-01-30 MED ORDER — VANCOMYCIN 50 MG/ML ORAL SOLUTION
125.0000 mg | Freq: Four times a day (QID) | ORAL | Status: DC
  Administered 2014-01-30 – 2014-02-01 (×7): 125 mg via ORAL
  Filled 2014-01-30 (×9): qty 2.5

## 2014-01-30 NOTE — Progress Notes (Signed)
Called IM on call and spoke with Dr. Eulas Post regarding Cdiff+ status. She was ok with starting trxt for Cdiff. Pt qualified as severe Cdiff with WBC>15 for treatment with po Vancomycin. Call RN to make aware for contact precautions.  George Fuller, PharmD, [facility] Clinical Staff Pharmacist Pager [phone]

## 2014-01-30 NOTE — Progress Notes (Signed)
Subjective: Pt with some nausea this morning but denies emesis. Tolerating full liquid diet, but states that he is taking things slowly. Pt with dark stools that are beginning to lighten up.    Objective: Vital signs in last 24 hours: Filed Vitals:   01/29/14 1150 01/29/14 1306 01/29/14 2004 01/30/14 0343  BP: 108/41 100/62 108/64 114/66  Pulse: 85 80 88 86  Temp: 98.4 F (36.9 C) 98.2 F (36.8 C) 99.4 F (37.4 C) 98.5 F (36.9 C)  TempSrc: Oral Oral Oral Oral  Resp: 22 15 16 16   Height:  5\' 11"  (1.803 m)    Weight:  215 lb 8 oz (97.75 kg)    SpO2: 96% 91% 94% 92%   Weight change: 15 lb 8 oz (7.031 kg)  Intake/Output Summary (Last 24 hours) at 01/30/14 0848 Last data filed at 01/30/14 0227  Gross per 24 hour  Intake 1723.75 ml  Output    400 ml  Net 1323.75 ml   Physical Exam: General: NAD, alert, resting in bed  HEENT: NCAT, vision grossly intact Cardiac: RRR, 3/6 systolic murmur Pulm: CTAB Abd: soft, no TTP, mildly distended, +BS Ext: warm and well perfused, no pedal edema  Neuro: alert and oriented X3, cranial nerves II-XII grossly intact, moving all extremities spontaneously  Lab Results: Basic Metabolic Panel:  Recent Labs Lab 01/29/14 0252 01/30/14 0025  NA 137 140  K 4.4 4.2  CL 108 113*  CO2 20 20  GLUCOSE 92 93  BUN 38* 25*  CREATININE 1.04 0.95  CALCIUM 7.4* 7.1*   Liver Function Tests:  Recent Labs Lab 01/29/14 0252 01/30/14 0025  AST 115* 98*  ALT 104* 91*  ALKPHOS 81 83  BILITOT 1.6* 1.1  PROT 5.0* 5.0*  ALBUMIN 2.1* 2.1*    Recent Labs Lab 01/28/14 0821  LIPASE 40    Recent Labs Lab 01/28/14 0816  AMMONIA 38   CBC:  Recent Labs Lab 01/29/14 1622 01/30/14 0025  WBC 10.7* 10.8*  HGB 8.3* 8.1*  HCT 24.1* 23.3*  MCV 93.1 93.2  PLT 82* 71*   Cardiac Enzymes:  Recent Labs Lab 01/28/14 0821 01/28/14 1245 01/29/14 0815  TROPONINI <0.30 <0.30 <0.30   Coagulation:  Recent Labs Lab 01/28/14 0821  LABPROT  18.4*  INR 1.58*   Micro Results: Recent Results (from the past 240 hour(s))  MRSA PCR SCREENING     Status: None   Collection Time    01/28/14 11:14 AM      Result Value Ref Range Status   MRSA by PCR NEGATIVE  NEGATIVE Final   Comment:            The GeneXpert MRSA Assay (FDA     approved for NASAL specimens     only), is one component of a     comprehensive MRSA colonization     surveillance program. It is not     intended to diagnose MRSA     infection nor to guide or     monitor treatment for     MRSA infections.   Studies/Results: Ct Abdomen Pelvis W Contrast  01/28/2014   CLINICAL DATA:  Throwing up blood, lower abdominal pain  EXAM: CT ABDOMEN AND PELVIS WITH CONTRAST  TECHNIQUE: Multidetector CT imaging of the abdomen and pelvis was performed using the standard protocol following bolus administration of intravenous contrast.  CONTRAST:  12mL OMNIPAQUE IOHEXOL 300 MG/ML  SOLN  COMPARISON:  None.  FINDINGS: There is bilateral mild chronic interstitial disease, right  greater than left. There are esophageal varices noted. There is coronary artery atherosclerosis involving the left main, LAD, circumflex and right coronary artery.  The liver is diminutive in size with a micronodular contour more consistent with cirrhosis. There is no intrahepatic or extrahepatic biliary ductal dilatation. There is severe gallbladder wall thickening. There is a small amount air within the gallbladder. There is mild splenomegaly. The kidneys, adrenal glands and pancreas are normal. The bladder is unremarkable.  There is no bowel dilatation to suggest obstruction. There is a small amount of abdominal ascites. There is bowel wall thickening involving the small bowel and colon as can be seen with a hypoalbuminemic and cirrhotic state. There is no pneumoperitoneum, pneumatosis, or portal venous gas. There is no abdominal or pelvic free fluid. There is no lymphadenopathy.  The abdominal aorta is normal in caliber  with atherosclerosis.  There are no lytic or sclerotic osseous lesions. There is degenerative disc disease at L3-4, L4-5 and L5-S1.  IMPRESSION: 1. Cirrhotic liver.  No hyperenhancing mass to suggest a hepatoma. 2. Small amount of abdominal ascites with bowel wall thickening involving the small bowel and colon as can be seen in the setting of hypoalbuminemia and cirrhosis versus enterocolitis. 3. Gallbladder wall thickening likely related to hepatocellular disease. 4. Small amount of air within the gallbladder which may be secondary to recent instrumentation. In the absence of recent instrumentation the appearance is concerning for infection. 5. Mild splenomegaly and esophageal varices.   Electronically Signed   By: Kathreen Devoid   On: 01/28/2014 22:48   Dg Chest Portable 1 View  01/28/2014   CLINICAL DATA:  GI bleed  EXAM: PORTABLE CHEST - 1 VIEW  COMPARISON:  None.  FINDINGS: Mild/vague right lower lobe opacity is favored to reflect scarring/atelectasis. Lungs are otherwise clear. No pleural effusion or pneumothorax.  The heart is normal in size.  Enteric tube coursing into the stomach.  IMPRESSION: No evidence of acute cardiopulmonary disease.   Electronically Signed   By: Julian Hy M.D.   On: 01/28/2014 10:12   Medications: I have reviewed the patient's current medications. Scheduled Meds: . cefTRIAXone (ROCEPHIN)  IV  1 g Intravenous Q24H  . nicotine  14 mg Transdermal Daily  . sodium chloride  3 mL Intravenous Q12H   Continuous Infusions: . sodium chloride 150 mL/hr at 01/30/14 0751   PRN Meds:.morphine injection, ondansetron (ZOFRAN) IV, ondansetron, promethazine  Assessment/Plan:  UGI bleed with acute blood loss anemia: 2/2 esophageal varices and portal HTN resulting in acute blood loss anemia. Patient presented with hematemesis, melena and +FOBT. EGD and banding (x7) of esophageal varices 4/16 by Dr. Benson Norway. BP remains stable, although Hgb slightly decreased this morning. Pt is s/p 2u  pRBCs this admission for Hgb down to 7.4. Given patient's esophageal varices and portal HTN on EGD, CT abd/pelvis was performed which showed cirrhosis, no masses. There was also some air in the gall bladder indicative of possible infection (though asymptomatic?). Pt found to be Hep C positive, genotyping pending. HIV nonreactive. Hep B negative. Of note, troponin neg x 3. Lipase and ammonia wnl on admission. LFTs down trending. GI has stopped the protonix drip, advanced his diet and will continue to trend Hgb, if stable, possible d/c home tomorrow. - CBC q8h (Hb goal >8) - Regular diet as tolerated - ceftriaxone 1g IV daily  - morphine, zofran/phenergan prn   Elevated transaminase: Elevated AST and ALT in the setting of hypotension and active GI bleed is likely from the  transient hypotensive as mild liver injury.However, newly diagnosed w/ Hep C, so LFTs may be chronically elevated. LFTs down trending, ALT 139-->104 --> 91, AST 166-->115 -->98. Hep B negative. -CMP in the AM   Hyperkalemia: Resolved. K 6.0 in ED, bicarb and calcium gluconate given in the ED. No EKG changes.  Recent Labs Lab 01/28/14 0821 01/28/14 0831 01/28/14 1245 01/29/14 0252 01/30/14 0025  K 6.0* 5.6* 5.7* 4.4 4.2   Tobacco abuse: nicotine patch  DVT PPx: SCDs given active GI bleeding  Dispo: Disposition is deferred at this time, awaiting improvement of current medical problems.  Anticipated discharge in approximately 1-2 day(s).   The patient does not have a current PCP (No primary provider on file.) and may need an [facility] hospital follow-up appointment after discharge.  The patient does not have transportation limitations that hinder transportation to clinic appointments.  .Services Needed at time of discharge: Y = Yes, Blank = No PT:   OT:   RN:   Equipment:   Other:     LOS: 2 days   Otho Bellows, MD 01/30/2014, 8:48 AM

## 2014-01-30 NOTE — Progress Notes (Signed)
Subjective: Feeling well.  He had some melena, but it appears to be turning brown.  Objective: Vital signs in last 24 hours: Temp:  [98.2 F (36.8 C)-99.4 F (37.4 C)] 98.5 F (36.9 C) (04/18 0343) Pulse Rate:  [80-88] 86 (04/18 0343) Resp:  [15-22] 16 (04/18 0343) BP: (100-114)/(41-66) 114/66 mmHg (04/18 0343) SpO2:  [91 %-96 %] 92 % (04/18 0343) Weight:  [215 lb 8 oz (97.75 kg)] 215 lb 8 oz (97.75 kg) (04/17 1306) Last BM Date: 01/29/14  Intake/Output from previous day: 04/17 0701 - 04/18 0700 In: 3976.8 [I.V.:3976.8] Out: 400 [Urine:400] Intake/Output this shift:    General appearance: alert and no distress GI: soft, non-tender; bowel sounds normal; no masses,  no organomegaly  Lab Results:  Recent Labs  01/29/14 0905 01/29/14 1622 01/30/14 0025  WBC 12.5* 10.7* 10.8*  HGB 8.5* 8.3* 8.1*  HCT 24.5* 24.1* 23.3*  PLT 84* 82* 71*   BMET  Recent Labs  01/28/14 1245 01/29/14 0252 01/30/14 0025  NA 138 137 140  K 5.7* 4.4 4.2  CL 105 108 113*  CO2 17* 20 20  GLUCOSE 104* 92 93  BUN 48* 38* 25*  CREATININE 1.10 1.04 0.95  CALCIUM 7.8* 7.4* 7.1*   LFT  Recent Labs  01/30/14 0025  PROT 5.0*  ALBUMIN 2.1*  AST 98*  ALT 91*  ALKPHOS 83  BILITOT 1.1   PT/INR  Recent Labs  01/28/14 0821  LABPROT 18.4*  INR 1.58*   Hepatitis Panel  Recent Labs  01/28/14 1245 01/29/14 0252  HEPBSAG NEGATIVE NEGATIVE  HCVAB Reactive* Reactive*  HEPAIGM NON REACTIVE  --   HEPBIGM NON REACTIVE  --    C-Diff No results found for this basename: CDIFFTOX,  in the last 72 hours Fecal Lactopherrin No results found for this basename: FECLLACTOFRN,  in the last 72 hours  Studies/Results: Ct Abdomen Pelvis W Contrast  01/28/2014   CLINICAL DATA:  Throwing up blood, lower abdominal pain  EXAM: CT ABDOMEN AND PELVIS WITH CONTRAST  TECHNIQUE: Multidetector CT imaging of the abdomen and pelvis was performed using the standard protocol following bolus administration of  intravenous contrast.  CONTRAST:  137mL OMNIPAQUE IOHEXOL 300 MG/ML  SOLN  COMPARISON:  None.  FINDINGS: There is bilateral mild chronic interstitial disease, right greater than left. There are esophageal varices noted. There is coronary artery atherosclerosis involving the left main, LAD, circumflex and right coronary artery.  The liver is diminutive in size with a micronodular contour more consistent with cirrhosis. There is no intrahepatic or extrahepatic biliary ductal dilatation. There is severe gallbladder wall thickening. There is a small amount air within the gallbladder. There is mild splenomegaly. The kidneys, adrenal glands and pancreas are normal. The bladder is unremarkable.  There is no bowel dilatation to suggest obstruction. There is a small amount of abdominal ascites. There is bowel wall thickening involving the small bowel and colon as can be seen with a hypoalbuminemic and cirrhotic state. There is no pneumoperitoneum, pneumatosis, or portal venous gas. There is no abdominal or pelvic free fluid. There is no lymphadenopathy.  The abdominal aorta is normal in caliber with atherosclerosis.  There are no lytic or sclerotic osseous lesions. There is degenerative disc disease at L3-4, L4-5 and L5-S1.  IMPRESSION: 1. Cirrhotic liver.  No hyperenhancing mass to suggest a hepatoma. 2. Small amount of abdominal ascites with bowel wall thickening involving the small bowel and colon as can be seen in the setting of hypoalbuminemia and cirrhosis versus  enterocolitis. 3. Gallbladder wall thickening likely related to hepatocellular disease. 4. Small amount of air within the gallbladder which may be secondary to recent instrumentation. In the absence of recent instrumentation the appearance is concerning for infection. 5. Mild splenomegaly and esophageal varices.   Electronically Signed   By: Kathreen Devoid   On: 01/28/2014 22:48   Dg Chest Portable 1 View  01/28/2014   CLINICAL DATA:  GI bleed  EXAM: PORTABLE  CHEST - 1 VIEW  COMPARISON:  None.  FINDINGS: Mild/vague right lower lobe opacity is favored to reflect scarring/atelectasis. Lungs are otherwise clear. No pleural effusion or pneumothorax.  The heart is normal in size.  Enteric tube coursing into the stomach.  IMPRESSION: No evidence of acute cardiopulmonary disease.   Electronically Signed   By: Julian Hy M.D.   On: 01/28/2014 10:12    Medications:  Scheduled: . cefTRIAXone (ROCEPHIN)  IV  1 g Intravenous Q24H  . nicotine  14 mg Transdermal Daily  . sodium chloride  3 mL Intravenous Q12H   Continuous: . sodium chloride 150 mL/hr at 01/30/14 0020  . pantoprozole (PROTONIX) infusion 8 mg/hr (01/29/14 1612)    Assessment/Plan: 1) HCV cirrhosis. 2) S/p esophageal variceal bleed. 3) Melena.   There is a mild decline in his HGB and his melena appears to be old.  I think one more day of observation is required before he can be safely discharged.  Plan: 1) Continue to monitor HGB. 2) D/C Protonix IV.  No role in this situation.   LOS: 2 days   Beryle Beams 01/30/2014, 7:36 AM

## 2014-01-31 DIAGNOSIS — D62 Acute posthemorrhagic anemia: Secondary | ICD-10-CM | POA: Diagnosis present

## 2014-01-31 DIAGNOSIS — A0472 Enterocolitis due to Clostridium difficile, not specified as recurrent: Secondary | ICD-10-CM | POA: Diagnosis not present

## 2014-01-31 LAB — CBC
HCT: 23.1 % — ABNORMAL LOW (ref 39.0–52.0)
HCT: 25.3 % — ABNORMAL LOW (ref 39.0–52.0)
HEMATOCRIT: 29.9 % — AB (ref 39.0–52.0)
HEMOGLOBIN: 8.6 g/dL — AB (ref 13.0–17.0)
Hemoglobin: 7.9 g/dL — ABNORMAL LOW (ref 13.0–17.0)
Hemoglobin: 10.1 g/dL — ABNORMAL LOW (ref 13.0–17.0)
MCH: 32.6 pg (ref 26.0–34.0)
MCH: 32.6 pg (ref 26.0–34.0)
MCH: 31.9 pg (ref 26.0–34.0)
MCHC: 34.2 g/dL (ref 30.0–36.0)
MCHC: 34 g/dL (ref 30.0–36.0)
MCHC: 33.8 g/dL (ref 30.0–36.0)
MCV: 95.5 fL (ref 78.0–100.0)
MCV: 95.8 fL (ref 78.0–100.0)
MCV: 94.3 fL (ref 78.0–100.0)
PLATELETS: 73 10*3/uL — AB (ref 150–400)
Platelets: 76 10*3/uL — ABNORMAL LOW (ref 150–400)
Platelets: 99 10*3/uL — ABNORMAL LOW (ref 150–400)
RBC: 2.42 MIL/uL — AB (ref 4.22–5.81)
RBC: 2.64 MIL/uL — ABNORMAL LOW (ref 4.22–5.81)
RBC: 3.17 MIL/uL — ABNORMAL LOW (ref 4.22–5.81)
RDW: 18.2 % — ABNORMAL HIGH (ref 11.5–15.5)
RDW: 18.5 % — ABNORMAL HIGH (ref 11.5–15.5)
RDW: 19.4 % — ABNORMAL HIGH (ref 11.5–15.5)
WBC: 14.1 10*3/uL — ABNORMAL HIGH (ref 4.0–10.5)
WBC: 11.1 10*3/uL — ABNORMAL HIGH (ref 4.0–10.5)
WBC: 14.7 10*3/uL — ABNORMAL HIGH (ref 4.0–10.5)

## 2014-01-31 LAB — PREPARE RBC (CROSSMATCH)

## 2014-01-31 MED ORDER — PROMETHAZINE HCL 25 MG PO TABS: 12.5000 mg | ORAL_TABLET | Freq: Four times a day (QID) | ORAL | Status: DC | PRN

## 2014-01-31 MED ORDER — OXYCODONE HCL 5 MG PO TABS
5.0000 mg | ORAL_TABLET | Freq: Four times a day (QID) | ORAL | Status: DC | PRN
  Administered 2014-01-31 – 2014-02-01 (×3): 5 mg via ORAL
  Filled 2014-01-31 (×3): qty 1

## 2014-01-31 NOTE — Progress Notes (Addendum)
Subjective: Pt with diarrhea, C.diff positive, po Vancomycin started overnight. Pt states that he is feeling better. He denies nausea, vomiting, or abdominal pain and is eating more. Tolerating a regular diet.     Objective: Vital signs in last 24 hours: Filed Vitals:   01/30/14 0343 01/30/14 1500 01/30/14 2150 01/31/14 0535  BP: 114/66 122/61 115/66 106/64  Pulse: 86 90 105 83  Temp: 98.5 F (36.9 C) 100 F (37.8 C) 99.2 F (37.3 C) 98.4 F (36.9 C)  TempSrc: Oral Oral Oral Oral  Resp: 16 18 18 18   Height:      Weight:      SpO2: 92% 93% 92% 94%   Weight change:   Intake/Output Summary (Last 24 hours) at 01/31/14 1006 Last data filed at 01/31/14 0612  Gross per 24 hour  Intake 5452.5 ml  Output      0 ml  Net 5452.5 ml   Physical Exam: General: NAD, alert, resting in bed  HEENT: NCAT, vision grossly intact Cardiac: RRR, 3/6 systolic murmur Pulm: CTAB Abd: soft, no TTP, mildly distended, +BS Ext: warm and well perfused, no pedal edema  Neuro: alert and oriented X3, cranial nerves II-XII grossly intact, moving all extremities spontaneously  Lab Results: Basic Metabolic Panel:  Recent Labs Lab 01/29/14 0252 01/30/14 0025  NA 137 140  K 4.4 4.2  CL 108 113*  CO2 20 20  GLUCOSE 92 93  BUN 38* 25*  CREATININE 1.04 0.95  CALCIUM 7.4* 7.1*   Liver Function Tests:  Recent Labs Lab 01/29/14 0252 01/30/14 0025  AST 115* 98*  ALT 104* 91*  ALKPHOS 81 83  BILITOT 1.6* 1.1  PROT 5.0* 5.0*  ALBUMIN 2.1* 2.1*    Recent Labs Lab 01/28/14 0821  LIPASE 40    Recent Labs Lab 01/28/14 0816  AMMONIA 38   CBC:  Recent Labs Lab 01/30/14 1546 01/31/14 0116  WBC 17.3* 14.1*  HGB 8.1* 7.9*  HCT 23.6* 23.1*  MCV 94.4 95.5  PLT 76* 73*   Cardiac Enzymes:  Recent Labs Lab 01/28/14 0821 01/28/14 1245 01/29/14 0815  TROPONINI <0.30 <0.30 <0.30   Coagulation:  Recent Labs Lab 01/28/14 0821  LABPROT 18.4*  INR 1.58*   Micro  Results: Recent Results (from the past 240 hour(s))  MRSA PCR SCREENING     Status: None   Collection Time    01/28/14 11:14 AM      Result Value Ref Range Status   MRSA by PCR NEGATIVE  NEGATIVE Final   Comment:            The GeneXpert MRSA Assay (FDA     approved for NASAL specimens     only), is one component of a     comprehensive MRSA colonization     surveillance program. It is not     intended to diagnose MRSA     infection nor to guide or     monitor treatment for     MRSA infections.  CLOSTRIDIUM DIFFICILE BY PCR     Status: Abnormal   Collection Time    01/30/14  4:35 PM      Result Value Ref Range Status   C difficile by pcr POSITIVE (*) NEGATIVE Final   Comment: CRITICAL RESULT CALLED TO, READ BACK BY AND VERIFIED WITH:     BAILEY,P RN 01/30/14 1910 WOOTEN,K   Studies/Results: No results found. Medications: I have reviewed the patient's current medications. Scheduled Meds: . nicotine  14 mg  Transdermal Daily  . sodium chloride  3 mL Intravenous Q12H  . vancomycin  125 mg Oral QID   Continuous Infusions: . sodium chloride 150 mL/hr at 01/31/14 0537   PRN Meds:.morphine injection, ondansetron (ZOFRAN) IV, ondansetron, promethazine  Assessment/Plan:  UGI bleed with acute blood loss anemia: 2/2 esophageal varices and portal HTN resulting in acute blood loss anemia. Patient presented with hematemesis, melena and +FOBT. EGD and banding (x7) of esophageal varices 4/16 by Dr. Benson Norway. BP remains stable, although Hgb slightly decreased this morning. Pt is s/p 2u pRBCs this admission for Hgb down to 7.4. Given patient's esophageal varices and portal HTN on EGD, CT abd/pelvis was performed which showed cirrhosis, no masses. There was also some air in the gall bladder indicative of possible infection (though asymptomatic?). Pt found to be Hep C positive, genotyping pending. HIV nonreactive. Hep B negative. Of note, troponin neg x 3. Lipase and ammonia wnl on admission. LFTs down  trending. Pt is off the protonix drip, and is tolerating a regular diet. Hgb fairly stable, but he is right at the transfusion threshold, so will transfuse today and, if stable, possible d/c home tomorrow. Pt to f/u with Dr. Benson Norway as an outpatient in 2 weeks. - Transfuse 1u pRBCs - Post tx CBC - CBC q12h (Hb goal >8) - Regular diet as tolerated - D/c ceftriaxone 1g IV daily now that he is on the Vanc - Transitioning to po pain control and antiemetics   C.diff diarrhea: Pt with multiple episodes of diarrhea on 4/18. C.diff +. With WBC count >15, he was considered to have "severe" C.diff infection, and po Vancomycin was started.  - Vancomycin po 125mg  QID x14 days, day 2/14  Hepatitis C: Elevated AST and ALT on admission thought to be in the setting of hypotension and active GI bleed and was  transient as mild liver injury.However, newly diagnosed w/ Hep C, so LFTs may be chronically elevated. LFTs are improving, ALT 139-->104 --> 91, AST 166-->115 -->98. Hep B negative. Hep C genotype pending. Per Dr. Benson Norway, he needs to apply for Medicaid which will provide coverage for the Hep C therapy. F/u with him in 2 weeks. -CMP in the AM  - F/u Hep C genotype - SW consult to help start Mediciad  Hyperkalemia: Resolved. K 6.0 in ED, bicarb and calcium gluconate given in the ED. No EKG changes.  Recent Labs Lab 01/28/14 0821 01/28/14 0831 01/28/14 1245 01/29/14 0252 01/30/14 0025  K 6.0* 5.6* 5.7* 4.4 4.2    Tobacco abuse: Pt counseled on cessation. On nicotine patch in the hospital.  DVT PPx: SCDs given active GI bleeding  Dispo: Disposition is deferred at this time, awaiting improvement of current medical problems.  Anticipated discharge in approximately 1-2 day(s).   The patient does not have a current PCP (No primary provider on file.) and may need an [facility] hospital follow-up appointment after discharge.  The patient does not have transportation limitations that hinder transportation to clinic  appointments.  .Services Needed at time of discharge: Y = Yes, Blank = No PT:   OT:   RN:   Equipment:   Other:     LOS: 3 days   Otho Bellows, MD 01/31/2014, 10:06 AM

## 2014-01-31 NOTE — Progress Notes (Signed)
Subjective: Feeling well.  Diarrhea markedly improved.  Objective: Vital signs in last 24 hours: Temp:  [98.4 F (36.9 C)-100 F (37.8 C)] 98.4 F (36.9 C) (04/19 0535) Pulse Rate:  [83-105] 83 (04/19 0535) Resp:  [18] 18 (04/19 0535) BP: (106-122)/(61-66) 106/64 mmHg (04/19 0535) SpO2:  [92 %-94 %] 94 % (04/19 0535) Last BM Date: 01/30/14  Intake/Output from previous day: 04/18 0701 - 04/19 0700 In: 5552.5 [P.O.:100; I.V.:5452.5] Out: -  Intake/Output this shift:    General appearance: alert and no distress GI: soft, non-tender; bowel sounds normal; no masses,  no organomegaly  Lab Results:  Recent Labs  01/30/14 0828 01/30/14 1546 01/31/14 0116  WBC 13.1* 17.3* 14.1*  HGB 8.0* 8.1* 7.9*  HCT 23.1* 23.6* 23.1*  PLT 65* 76* 73*   BMET  Recent Labs  01/28/14 1245 01/29/14 0252 01/30/14 0025  NA 138 137 140  K 5.7* 4.4 4.2  CL 105 108 113*  CO2 17* 20 20  GLUCOSE 104* 92 93  BUN 48* 38* 25*  CREATININE 1.10 1.04 0.95  CALCIUM 7.8* 7.4* 7.1*   LFT  Recent Labs  01/30/14 0025  PROT 5.0*  ALBUMIN 2.1*  AST 98*  ALT 91*  ALKPHOS 83  BILITOT 1.1   PT/INR  Recent Labs  01/28/14 0821  LABPROT 18.4*  INR 1.58*   Hepatitis Panel  Recent Labs  01/28/14 1245 01/29/14 0252  HEPBSAG NEGATIVE NEGATIVE  HCVAB Reactive* Reactive*  HEPAIGM NON REACTIVE  --   HEPBIGM NON REACTIVE  --    C-Diff No results found for this basename: CDIFFTOX,  in the last 72 hours Fecal Lactopherrin No results found for this basename: FECLLACTOFRN,  in the last 72 hours  Studies/Results: No results found.  Medications:  Scheduled: . cefTRIAXone (ROCEPHIN)  IV  1 g Intravenous Q24H  . nicotine  14 mg Transdermal Daily  . sodium chloride  3 mL Intravenous Q12H  . vancomycin  125 mg Oral QID   Continuous: . sodium chloride 150 mL/hr at 01/31/14 0537    Assessment/Plan: 1) S/p esophageal variceal bleed. 2) HCV cirrhosis. 3) C. Diff.   The patient reports  feeling better today since starting on flagyl.  His HGB appears to be relatively stable.  No further GI intervention at this time.  I will have him follow up in the office.  Plan: 1) Continue with Flagyl. 2) Follow up in the office in 2 weeks. 3) Signing off.   LOS: 3 days   Beryle Beams 01/31/2014, 7:50 AM

## 2014-02-01 LAB — TYPE AND SCREEN
ABO/RH(D): O POS
Antibody Screen: NEGATIVE
UNIT DIVISION: 0
Unit division: 0
Unit division: 0

## 2014-02-01 LAB — CBC
HEMATOCRIT: 27.7 % — AB (ref 39.0–52.0)
HEMOGLOBIN: 9.4 g/dL — AB (ref 13.0–17.0)
MCH: 32.4 pg (ref 26.0–34.0)
MCHC: 33.9 g/dL (ref 30.0–36.0)
MCV: 95.5 fL (ref 78.0–100.0)
Platelets: 67 10*3/uL — ABNORMAL LOW (ref 150–400)
RBC: 2.9 MIL/uL — AB (ref 4.22–5.81)
RDW: 19.7 % — AB (ref 11.5–15.5)
WBC: 8 10*3/uL (ref 4.0–10.5)

## 2014-02-01 LAB — COMPREHENSIVE METABOLIC PANEL
ALBUMIN: 2.3 g/dL — AB (ref 3.5–5.2)
ALT: 77 U/L — ABNORMAL HIGH (ref 0–53)
AST: 72 U/L — ABNORMAL HIGH (ref 0–37)
Alkaline Phosphatase: 105 U/L (ref 39–117)
BUN: 20 mg/dL (ref 6–23)
CALCIUM: 7.3 mg/dL — AB (ref 8.4–10.5)
CO2: 21 mEq/L (ref 19–32)
Chloride: 108 mEq/L (ref 96–112)
Creatinine, Ser: 0.89 mg/dL (ref 0.50–1.35)
GFR calc Af Amer: >90 mL/min (ref >90)
GFR calc non Af Amer: >90 mL/min (ref >90)
Glucose, Bld: 101 mg/dL — ABNORMAL HIGH (ref 70–99)
Potassium: 3.8 mEq/L (ref 3.7–5.3)
Sodium: 137 mEq/L (ref 137–147)
Total Bilirubin: 1.7 mg/dL — ABNORMAL HIGH (ref 0.3–1.2)
Total Protein: 5.6 g/dL — ABNORMAL LOW (ref 6.0–8.3)

## 2014-02-01 MED ORDER — VANCOMYCIN 50 MG/ML ORAL SOLUTION: 125.0000 mg | Freq: Four times a day (QID) | ORAL | Status: DC

## 2014-02-01 MED ORDER — VANCOMYCIN HCL 125 MG PO CAPS: 125.0000 mg | ORAL_CAPSULE | Freq: Four times a day (QID) | ORAL | Status: DC

## 2014-02-01 MED ORDER — OXYCODONE HCL 5 MG PO TABS: 5.0000 mg | ORAL_TABLET | ORAL | Status: DC | PRN

## 2014-02-01 NOTE — Progress Notes (Signed)
   I have seen the patient and reviewed the daily progress note by Durene Fruits MS3 and discussed the care of the patient with them.  See my note for documentation of my findings, assessment, and plans.

## 2014-02-01 NOTE — Progress Notes (Signed)
Subjective: Pt still with some abd "pressure" and some continued brown/green diarrhea. The diarrhea is improving, however. No F/C, abd pain, N/V. No more melena, no BRBPR. Still tolerating regular diet.  Objective: Vital signs in last 24 hours: Filed Vitals:   01/31/14 1320 01/31/14 1416 01/31/14 2118 02/01/14 0516  BP: 115/75 111/69 130/75 112/68  Pulse: 95 89 91 73  Temp: 98.4 F (36.9 C) 98.4 F (36.9 C) 98.2 F (36.8 C) 97.3 F (36.3 C)  TempSrc: Oral Oral Oral Oral  Resp: 17 17 17 17   Height:      Weight:      SpO2: 95% 96% 94% 92%   Weight change:   Intake/Output Summary (Last 24 hours) at 02/01/14 0854 Last data filed at 02/01/14 0827  Gross per 24 hour  Intake 2087.5 ml  Output    100 ml  Net 1987.5 ml   Physical Exam: General: NAD, alert, resting in bed  HEENT: NCAT, vision grossly intact Cardiac: RRR Pulm: CTAB Abd: soft, no TTP, mildly distended, +BS Ext: warm and well perfused, no pedal edema  Neuro: alert and oriented X3, cranial nerves II-XII grossly intact, moving all extremities spontaneously  Lab Results: Basic Metabolic Panel:  Recent Labs Lab 01/30/14 0025 02/01/14 0517  NA 140 137  K 4.2 3.8  CL 113* 108  CO2 20 21  GLUCOSE 93 101*  BUN 25* 20  CREATININE 0.95 0.89  CALCIUM 7.1* 7.3*   Liver Function Tests:  Recent Labs Lab 01/30/14 0025 02/01/14 0517  AST 98* 72*  ALT 91* 77*  ALKPHOS 83 105  BILITOT 1.1 1.7*  PROT 5.0* 5.6*  ALBUMIN 2.1* 2.3*    Recent Labs Lab 01/28/14 0821  LIPASE 40    Recent Labs Lab 01/28/14 0816  AMMONIA 38   CBC:  Recent Labs Lab 01/31/14 1830 02/01/14 0517  WBC 14.7* 8.0  HGB 10.1* 9.4*  HCT 29.9* 27.7*  MCV 94.3 95.5  PLT 99* 67*   Cardiac Enzymes:  Recent Labs Lab 01/28/14 0821 01/28/14 1245 01/29/14 0815  TROPONINI <0.30 <0.30 <0.30   Coagulation:  Recent Labs Lab 01/28/14 0821  LABPROT 18.4*  INR 1.58*   Micro Results: Recent Results (from the past 240  hour(s))  MRSA PCR SCREENING     Status: None   Collection Time    01/28/14 11:14 AM      Result Value Ref Range Status   MRSA by PCR NEGATIVE  NEGATIVE Final   Comment:            The GeneXpert MRSA Assay (FDA     approved for NASAL specimens     only), is one component of a     comprehensive MRSA colonization     surveillance program. It is not     intended to diagnose MRSA     infection nor to guide or     monitor treatment for     MRSA infections.  CLOSTRIDIUM DIFFICILE BY PCR     Status: Abnormal   Collection Time    01/30/14  4:35 PM      Result Value Ref Range Status   C difficile by pcr POSITIVE (*) NEGATIVE Final   Comment: CRITICAL RESULT CALLED TO, READ BACK BY AND VERIFIED WITH:     BAILEY,P RN 01/30/14 1910 WOOTEN,K   Studies/Results: No results found. Medications: I have reviewed the patient's current medications. Scheduled Meds: . nicotine  14 mg Transdermal Daily  . sodium chloride  3 mL  Intravenous Q12H  . vancomycin  125 mg Oral QID   Continuous Infusions: . sodium chloride 150 mL/hr at 01/31/14 2037   PRN Meds:.ondansetron (ZOFRAN) IV, ondansetron, oxyCODONE, promethazine  Assessment/Plan:  UGI bleed with acute blood loss anemia: Patient overall doing well with no more melena. Tolerating regular diet. VSS. Hb stable this morning 7.9 yesterday and 8.6 after receiving 1 more unit pRBCs. Hb overnight was 10.1-->9.4. Pt will follow up with GI in 2 weeks.  -discharge today -regular diet -f/u GI in 2 weeks   Severe C.diff diarrhea: Improving. Pt with multiple episodes of diarrhea on 4/18. C.diff +. With WBC count >15, he was considered to have "severe" C.diff infection. PO vancomycin 125mg  QID started night of 4/18. - continue vancomycin, day 2/14 today  Hepatitis C:  LFTs are improving, ALT 139-->104 -->77, AST 166-->115 -->72.  Hep C genotype pending. Per Dr. Benson Norway, he needs to apply for Medicaid which will provide coverage for the Hep C therapy.  -GI f/u  in 2 weeks - F/u Hep C genotype - SW consult to help start Mediciad  Hyperkalemia: Resolved. K 3.8 today. Tobacco abuse: Pt counseled on cessation. On nicotine patch in the hospital. DVT PPx: SCDs given active GI bleed on admission  Dispo: Discharge today.  The patient does not have a current PCP (No primary provider on file.) and may need an [facility] hospital follow-up appointment after discharge.  The patient does not have transportation limitations that hinder transportation to clinic appointments.  .Services Needed at time of discharge: Y = Yes, Blank = No PT:   OT:   RN:   Equipment:   Other:     LOS: 4 days   Rebecca Eaton, MD 02/01/2014, 8:54 AM

## 2014-02-01 NOTE — Discharge Instructions (Signed)
Please find a primary care doctor to follow up with in 1-2 weeks. There are some options listed below.  Please apply for medicaid as this will help pay for your hepatitis C treatment.  Please follow up with GI in 2 weeks as was scheduled for you.   Esophageal Varices Esophageal varices are blood vessels in the esophagus (the tube that carries food to the stomach). Under normal circumstances, these blood vessels carry very small amounts of blood. If the liver is damaged, and the main vein (portal vein) that carries blood is blocked, larger amounts of blood might back up into these esophageal varices. The esophageal varices are too fragile for this extra blood flow and pressure. They may swell and then break, causing life-threatening bleeding (hemorrhage). CAUSES  Any kind of liver disease can cause esophageal varices. Cirrhosis of the liver, usually due to alcoholism, is the most common reason. Other reasons include:  Severe heart failure: When the heart cannot pump blood around the body effectively enough, pressure may rise in the portal vein.  A blood clot in the portal vein.  Sarcoidosis. This is an inflammatory disease that can affect the liver.  Schistosomiasis. A parasitic infection that can cause liver damage. SYMPTOMS  Symptoms may include:  Vomiting bright red or black coffee ground like material.  Black, tarry stools.  Low blood pressure.  Dizziness.  Loss of consciousness. DIAGNOSIS  When someone has known cirrhosis, their caregiver may screen them for the presence of esophageal varices. Tests that are used include:  Endoscopy (esophagogastroduodenoscopy or EGD). A thin, lighted tube is inserted through the mouth and into the esophagus. The caregiver will rate the varices according to their size and the presence of red streaks. These characteristics help determine the risk of bleeding.  Imaging tests. CT scans and MRI scans can both show esophageal varices. However, they  cannot predict likelihood of bleeding. TREATMENT  There are different types of treatment used for esophageal varices. These include:  Variceal ligation. During EGD, the caregiver places a rubber band around the vein to prevent bleeding.  Injection therapy. During EGD, the caregiver can inject the veins with a solution that shrinks them and scars them closed.  Medications can decrease the pressure in the esophageal varices and prevent bleeding.  Balloon tamponade. A tube is put into the esophagus and a balloon is passed down it. When the balloon is inflated, it puts pressures on the veins and stops the bleeding.  Shunt. A small tube is placed within the liver veins. This decreases the blood flow and pressure to the varices, decreasing bleeding risk.  Liver transplant may be done as a last resort. HOME CARE INSTRUCTIONS   Take all medications exactly as directed.  Follow any prescribed diet. Avoid alcohol if recommended.  Follow instructions regarding both rest and physical activity.  Seek help to treat a drinking problem. SEEK IMMEDIATE MEDICAL CARE IF:  You vomit blood or coffee-ground material.  You pass black tarry stools or bright red blood in the stools.  You are dizzy, lightheaded or faint.  You are unable to eat or drink.  You experience chest pain. Document Released: 12/22/2003 Document Revised: 12/24/2011 Document Reviewed: 12/02/2008 [facility] Patient Information 2014 [place].   Cirrhosis Cirrhosis is a condition of scarring of the liver which is caused when the liver has tried repairing itself following damage. This damage may come from a previous infection such as one of the forms of hepatitis (usually hepatitis C), or the damage may come  from being injured by toxins. The main toxin that causes this damage is alcohol. The scarring of the liver from use of alcohol is irreversible. That means the liver cannot return to normal even though alcohol is not used any  more. The main danger of hepatitis C infection is that it may cause long-lasting (chronic) liver disease, and this also may lead to cirrhosis. This complication is progressive and irreversible. CAUSES  Prior to available blood tests, hepatitis C could be contracted by blood transfusions. Since testing of blood has improved, this is now unlikely. This infection can also be contracted through intravenous drug use and the sharing of needles. It can also be contracted through sexual relationships. The injury caused by alcohol comes from too much use. It is not a few drinks that poison the liver, but years of misuse. Usually there will be some signs and symptoms early with scarring of the liver that suggest the development of better habits. Alcohol should never be used while using acetaminophen. A small dose of both taken together may cause irreversible damage to the liver. HOME CARE INSTRUCTIONS  There is no specific treatment for cirrhosis. However, there are things you can do to avoid making the condition worse.  Rest as needed.  Eat a well-balanced diet. Your caregiver can help you with suggestions.  Vitamin supplements including vitamins A, K, D, and thiamine can help.  A low-salt diet, water restriction, or diuretic medicine may be needed to reduce fluid retention.  Avoid alcohol. This can be extremely toxic if combined with acetaminophen.  Avoid drugs which are toxic to the liver. Some of these include isoniazid, methyldopa, acetaminophen, anabolic steroids (muscle-building drugs), erythromycin, and oral contraceptives (birth control pills). Check with your caregiver to make sure medicines you are presently taking will not be harmful.  Periodic blood tests may be required. Follow your caregiver's advice regarding the timing of these.  Milk thistle is an herbal remedy which does protect the liver against toxins. However, it will not help once the liver has been scarred. SEEK MEDICAL CARE  IF:  You have increasing fatigue or weakness.  You develop swelling of the hands, feet, legs, or face.  You vomit bright red blood, or a coffee ground appearing material.  You have blood in your stools, or the stools turn black and tarry.  You have a fever.  You develop loss of appetite, or have nausea and vomiting.  You develop jaundice.  You develop easy bruising or bleeding.  You have worsening of any of the problems you are concerned about. Document Released: 10/01/2005 Document Revised: 12/24/2011 Document Reviewed: 05/19/2008 [facility] Patient Information 2014 [place].   Hepatitis C Hepatitis C is a viral infection of the liver. Infection may go undetected for months or years because symptoms may be absent or very mild. Chronic liver disease is the main danger of hepatitis C. This may lead to scarring of the liver (cirrhosis), liver failure, and liver cancer. CAUSES  Hepatitis C is caused by the hepatitis C virus (HCV). Formerly, hepatitis C infections were most commonly transmitted through blood transfusions. In the early 1990s, routine testing of donated blood for hepatitis C and exclusion of blood that tests positive for HCV began. Now, HCV is most commonly transmitted from person to person through injection drug use, sharing needles, or sex with an infected person. A caregiver may also get the infection from exposure to the blood of an infected patient by way of a cut or needle stick.  SYMPTOMS  Acute Phase Many cases of acute HCV infection are mild and cause few problems.Some people may not even realize they are sick.Symptoms in others may last a few weeks to several months and include:  Feeling very tired.  Loss of appetite.  Nausea.  Vomiting.  Abdominal pain.  Dark yellow urine.  Yellow skin and eyes (jaundice).  Itching of the skin. Chronic Phase  Between 50% to 85% of people who get HCV infection become "chronic carriers." They often have no  symptoms, but the virus stays in their body.They may spread the virus to others and can get long-term liver disease.  Many people with chronic HCV infection remain healthy for many years. However, up to 1 in 5 chronically infected people may develop severe liver diseases including scarring of the liver (cirrhosis), liver failure, or liver cancer. DIAGNOSIS  Diagnosis of hepatitis C infection is made by testing blood for the presence of hepatitis C viral particles called RNA. Other tests may also be done to measure the status of current liver function, exclude other liver problems, or assess liver damage. TREATMENT  Treatment with many antiviral drugs is available and recommended for some patients with chronic HCV infection. Drug treatment is generally considered appropriate for patients who:  Are 61 years of age or older.  Have a positive test for HCV particles in the blood.  Have a liver tissue sample (biopsy) that shows chronic hepatitis and significant scarring (fibrosis).  Do not have signs of liver failure.  Have acceptable blood test results that confirm the wellness of other body organs.  Are willing to be treated and conform to treatment requirements.  Have no other circumstances that would prevent treatment from being recommended (contraindications). All people who are offered and choose to receive drug treatment must understand that careful medical follow up for many months and even years is crucial in order to make successful care possible. The goal of drug treatment is to eliminate any evidence of HCV in the blood on a long-term basis. This is called a "sustained virologic response" or SVR. Achieving a SVR is associated with a decrease in the chance of life-threatening liver problems, need for a liver transplant, liver cancer rates, and liver-related complications. Successful treatment currently requires taking treatment drugs for at least 24 weeks and up to 72 weeks. An injected  drug (interferon) given weekly and an oral antiviral medicine taken daily are usually prescribed. Side effects from these drugs are common and some may be very serious. Your response to treatment must be carefully monitored by both you and your caregiver throughout the entire treatment period. PREVENTION There is no vaccine for hepatitis C. The only way to prevent the disease is to reduce the risk of exposure to the virus.   Avoid sharing drug needles or personal items like toothbrushes, razors, and nail clippers with an infected person.  Healthcare workers need to avoid injuries and wear appropriate protective equipment such as gloves, gowns, and face masks when performing invasive medical or nursing procedures. HOME CARE INSTRUCTIONS  To avoid making your liver disease worse:  Strictly avoid drinking alcohol.  Carefully review all new prescriptions of medicines with your caregiver. Ask your caregiver which drugs you should avoid. The following drugs are toxic to the liver, and your caregiver may tell you to avoid them:  Isoniazid.  Methyldopa.  Acetaminophen.  Anabolic steroids (muscle-building drugs).  Erythromycin.  Oral contraceptives (birth control pills).  Check with your caregiver to make sure medicine you are currently taking  will not be harmful.  Periodic blood tests may be required. Follow your caregiver's advice about when you should have blood tests.  Avoid a sexual relationship until advised otherwise by your caregiver.  Avoid activities that could expose other people to your blood. Examples include sharing a toothbrush, nail clippers, razors, and needles.  Bed rest is not necessary, but it may make you feel better. Recovery time is not related to the amount of rest you receive.  This infection is contagious. Follow your caregiver's instructions in order to avoid spread of the infection. SEEK IMMEDIATE MEDICAL CARE IF:  You have increasing fatigue or  weakness.  You have an oral temperature above 102 F (38.9 C), not controlled by medicine.  You develop loss of appetite, nausea, or vomiting.  You develop jaundice.  You develop easy bruising or bleeding.  You develop any severe problems as a result of your treatment. MAKE SURE YOU:   Understand these instructions.  Will watch your condition.  Will get help right away if you are not doing well or get worse. Document Released: 09/28/2000 Document Revised: 12/24/2011 Document Reviewed: 01/31/2011 [facility] Patient Information 2014 [place], [place].   Emergency Department Resource Guide 1) Find a Doctor and Pay Out of Pocket Although you won't have to find out who is covered by your insurance plan, it is a good idea to ask around and get recommendations. You will then need to call the office and see if the doctor you have chosen will accept you as a new patient and what types of options they offer for patients who are self-pay. Some doctors offer discounts or will set up payment plans for their patients who do not have insurance, but you will need to ask so you aren't surprised when you get to your appointment.  2) Contact Your Local Health Department Not all health departments have doctors that can see patients for sick visits, but many do, so it is worth a call to see if yours does. If you don't know where your local health department is, you can check in your phone book. The CDC also has a tool to help you locate your state's health department, and many state websites also have listings of all of their local health departments.  3) Find a [facility] If your illness is not likely to be very severe or complicated, you may want to try a walk in clinic. These are popping up all over the country in pharmacies, drugstores, and shopping centers. They're usually staffed by nurse practitioners or physician assistants that have been trained to treat common illnesses and complaints. They're  usually fairly quick and inexpensive. However, if you have serious medical issues or chronic medical problems, these are probably not your best option.  No Primary Care Doctor: - Call Health Connect at  [phone] - they can help you locate a primary care doctor that  accepts your insurance, provides certain services, etc. - Physician Referral Service- [phone]  Chronic Pain Problems: Organization         Address  Phone   Notes  [facility]  [phone] Patients need to be referred by their primary care doctor.   Medication Assistance: Organization         Address  Phone   Notes  [facility] Medication [facility] [address] [place], [place] [address] [phone] --Must be a resident of [facility] -- Must have NO insurance coverage whatsoever (no Medicaid/ Medicare, etc.) --  The pt. MUST have a primary care doctor that directs their care regularly and follows them in the community   [facility]  [phone]   [employer]  [phone]    Agencies that provide inexpensive medical care: Organization         Address  Phone   Notes  [place]  [phone]   [facility]    [phone]   [facility] [place], [place] [address] [phone]   [facility] [address] [place] [phone]   Planned Parenthood    [phone]   [facility]    [phone]   [place] and [place] [address], [place] Phone:  [phone], Fax:  [phone] Hours of Operation:  9 am - 6 pm, M-F.  Also accepts Medicaid/Medicare and self-pay.  [facility] for [place] [address] [place], [address], [place] Phone: [phone], Fax: [phone]. Hours of Operation:  8:30 am - 5:30 pm, M-F.  Also accepts Medicaid and self-pay.  [facility] [address] [place] Phone:  [phone]   [place], [place], [place] [phone], Ext. [phone] Mondays & Thursdays: 7-9 AM.  First 15 patients are seen on a first come, first serve basis.    [place] Providers:  Organization         Address  Phone   Notes  [facility] [address], [place] [phone] Also accepts self-pay patients.  [facility] [id number] [place], [place]  [phone]   [place], [address], [place] [phone]   [facility] [address], [place] [phone]   Lucianne Lei [address], [place]   [phone] Only accepts [place] patients after they have their name applied to their card.   Self-Pay (no insurance) in [facility]:  Organization         Address  Phone   Notes  Sickle Cell Patients, [facility] [place] [phone]   [facility] [place] [phone]   [facility] [place]  [place], [place], [place] [phone]   [facility]/Dr. Osei-Bonsu  [address] [place] or [address], [place] [phone] Phone number for both [place] and [place] locations is the same.  [facility] [address], [place] [phone]   [facility] [address] [place] or [address] [phone]   [facility] [address] [place] [phone], phone; [phone], fax Sees patients 1st and 3rd Saturday of every month.  Must not qualify for public or private insurance (i.e. Medicaid, Medicare, [place] Health Choice, Veterans' Benefits)  Household income should be no more than 200% of the poverty level The clinic cannot treat  you if you are pregnant or think you are pregnant  Sexually transmitted diseases are not treated at the clinic.    Dental Care: Organization         Address  Phone  Notes  [facility] [phone]  [address] [place], [place] [phone] Accepts children up to age 3 who are enrolled in [place] or [place] Health Choice; pregnant women with a Medicaid card; and children who have applied for Medicaid or [place] Health Choice, but were declined, whose parents can pay a reduced fee at time of service.  [facility] Department of [facility]  [address], [place] [phone] Accepts children up to age 100 who are enrolled in [place] or [place] Health Choice; pregnant women with a Medicaid card; and children who have applied for Medicaid or [place] Health Choice, but were declined, whose parents can pay a reduced fee at time of service.  [facility]  [address] [place], [place] [phone] Patients are seen by appointment only. Walk-ins are not accepted. [facility] will see patients 44 years of age and older. Monday - Tuesday (8am-5pm) Most Wednesdays (8:30-5pm) $30 per visit, cash only  [facility] Adult Dental Access PRO[facility]  [address], [facility] [phone] Patients are seen by appointment only. Walk-ins are not accepted. [facility] will see patients 80 years of age and older. One Wednesday Evening (Monthly: Volunteer Based).  $30 per visit, cash only  [employer]  [phone] for adults; Children under age 10, call Graduate Pediatric Dentistry at [phone]. Children aged 55-14, please call [phone] to request a pediatric application.  Dental services are provided in all areas of dental care including fillings, crowns and bridges, complete and partial dentures, implants, gum treatment, root canals, and extractions. Preventive care is also provided. Treatment  is provided to both adults and children. Patients are selected via a lottery and there is often a waiting list.   [facility] [address] [place]  [phone] [email or web address]   [facility] [address] [place], [place] [phone], Ext. [phone] Second and Fourth Thursday of each month, opens at 6:30 AM; Clinic ends at 9 AM.  Patients are seen on a first-come first-served basis, and a limited number are seen during each clinic.   [facility]  [address] Ether [place], [place] [phone]   Eligibility Requirements You must have lived in [place], [place], or [place] counties for at least the last three months.   You cannot be eligible for state or federal sponsored [place], including [employer], [place], or [employer].   You generally cannot be eligible for healthcare insurance through your employer.    How to apply: Eligibility screenings are held every Tuesday and Wednesday afternoon from 1:00 pm until 4:00 pm. You do not need an appointment for the interview!  [facility] [address] [place], [place] [phone]   [facility] Health Department  [phone]   [facility] Health Department  [phone]   [facility] Health Department  [phone]    Behavioral Health Resources in the Community: Intensive Outpatient Programs Organization         Address  Phone  Notes  [facility] [address], [place], [place] [phone]   [facility] Outpatient [address] [place], [place] [phone]   ADS: [facility] [address] [place], [place]  [phone]   [facility] Mental Health [address],  [place], [place] [phone] or [phone]   Substance Abuse Resources Organization         Address  Phone  Notes  Alcohol and  Drug Services  [place]  [phone]   The  [place]   Chinita Pester  [phone]   Residential & Outpatient Substance Abuse Program  [phone]   Psychological Services Organization         Address  Phone  Notes  [facility]  [place]  [phone]   [place] [address], [place] or [phone]    Mobile Crisis Teams Organization         Address  Phone  Notes  [facility], Mobile Crisis Care Unit  [phone]   [facility]  [address] [place], [place]   [place] [address] [place] [phone]    Self-Help/Support Groups Organization         Address  Phone             Notes  [place]. of [place] - variety of support groups  [place] Call for more information  Narcotics Anonymous (NA), [facility] [address], [employer] [place]  2 meetings at this location   Special educational needs teacher         Address  Phone  Notes  [facility] [place],    [place]  [phone]   [facility]  [address] [place] [id number], [place], [place]   [place], [place] [phone] Admissions: 8am-3pm M-F  Incentives Substance [place] [address],    [place], [place] [phone]   [facility] [address] [place], [place], [place]   [facility] [address]  [place], [place]   [facility] - Intensive Outpatient [address] Kristeen [address], [place], [place]   [facility] [address]  [place], [place] [phone] or [phone]   [facility] ([facility]) [address] [place], [place] Accepts Medicaid  [facility] [place] [address]  [place] [phone] Substance Abuse/Addiction Treatment    [facility]: [place] Rhonda W Organization         Address  Phone  Notes  [facility]  [phone]   Domenic Schwab, PhD [address] [place]lis [place], [place]   [phone] or [phone]   [place], [place] [phone]   [facility] [address] [place], [place] [phone] Insurance/Medicaid/sponsorship through [facility] and Families [address], [address] [place]                                    [place], [place] [phone] [place] [address], [place] [phone]    Dr. Adele Schilder  [phone]   [facility] Dept. 1) [address] [place] 2) [place] 3)  [place] [address], [place] [phone]  [phone]   [place] [phone] or [phone] (After Hours)

## 2014-02-01 NOTE — Progress Notes (Signed)
Subjective:  George Fuller reports that he is doing better today. His last bowel movement was at 4am this morning and he reports it was less running then it has been and was followed by gas. He also reports having less stomach pain and is still having stomach distension. He denies nausea and vomiting.   Objective: Vital signs in last 24 hours: Filed Vitals:   01/31/14 1320 01/31/14 1416 01/31/14 2118 02/01/14 0516  BP: 115/75 111/69 130/75 112/68  Pulse: 95 89 91 73  Temp: 98.4 F (36.9 C) 98.4 F (36.9 C) 98.2 F (36.8 C) 97.3 F (36.3 C)  TempSrc: Oral Oral Oral Oral  Resp: 17 17 17 17   Height:      Weight:      SpO2: 95% 96% 94% 92%   Weight change:   Intake/Output Summary (Last 24 hours) at 02/01/14 0851 Last data filed at 02/01/14 0827  Gross per 24 hour  Intake 2087.5 ml  Output    100 ml  Net 1987.5 ml   Physical Exam: General: Resting comfortably in bed in no acute distress HEENT: PEERL, MMM Neck: Supple Cardio: RRR, No M/R/G Pulm: Lungs clear to auscultation bilaterally Abdominal: Non-tender, normal abdominal bowel sounds, stomach is slightly distended and tympanic to percussion Extremities: No edema Neuro: Alert and Oriented x3  Lab Results: @LABTEST2 @ Micro Results: Recent Results (from the past 240 hour(s))  MRSA PCR SCREENING     Status: None   Collection Time    01/28/14 11:14 AM      Result Value Ref Range Status   MRSA by PCR NEGATIVE  NEGATIVE Final   Comment:            The GeneXpert MRSA Assay (FDA     approved for NASAL specimens     only), is one component of a     comprehensive MRSA colonization     surveillance program. It is not     intended to diagnose MRSA     infection nor to guide or     monitor treatment for     MRSA infections.  CLOSTRIDIUM DIFFICILE BY PCR     Status: Abnormal   Collection Time    01/30/14  4:35 PM      Result Value Ref Range Status   C difficile by pcr POSITIVE (*) NEGATIVE Final   Comment: CRITICAL RESULT  CALLED TO, READ BACK BY AND VERIFIED WITH:     BAILEY,P RN 01/30/14 1910 WOOTEN,K   Studies/Results: No results found. Medications:  Scheduled: . nicotine  14 mg Transdermal Daily  . sodium chloride  3 mL Intravenous Q12H  . vancomycin  125 mg Oral QID   Continuous: . sodium chloride 150 mL/hr at 01/31/14 2037   HYW:VPXTGGYIRSW (ZOFRAN) IV, ondansetron, oxyCODONE, promethazine Scheduled Meds: . nicotine  14 mg Transdermal Daily  . sodium chloride  3 mL Intravenous Q12H  . vancomycin  125 mg Oral QID   Continuous Infusions: . sodium chloride 150 mL/hr at 01/31/14 2037   PRN Meds:.ondansetron (ZOFRAN) IV, ondansetron, oxyCODONE, promethazine  Assessment/Plan:George Fuller is a 58yo man who has no significant past medical history presented with a 2 day history of vomiting blood with tarry black stools found to have cirrhosis and chronic hepatitis C.  Principal Problem:   GI bleed Active Problems:   Tobacco abuse   Hyperkalemia   Elevated transaminase level   Hypotension   Hepatitis C   C. difficile diarrhea   Acute blood loss anemia  #Acute  GI bleed - Patient was having an upper GI bleed, with hematemasis and melena. GI consulted in and performed a upper endoscopy finding esophageal varices and cirrhosis.Banding of 7 varices was performed. Hg 8.5 dropped to 7.4 and was given 2 uPRBs with rise to Hg >8. Found to have hepatitis C on Hepatitis panel, genotype pending. HIV non-reactive. Negative troponins. Lipase and ammonia wnl on admission. LFTs down trending. Pt is off the protonix drip, and is tolerating a regular diet. Pt to f/u with Dr. Benson Norway as an outpatient in 2 weeks. Transfused 1u pRBCs yesterday. - CBC q12h (Hb goal >8)  - Discontinue Ceftriaxone, now on QID PO Vancomycin - Morphine PRN for pain  - Advance diet at [facility]erated  - Transitioning to po pain control and antiemetics   #C.diff diarrhea: Pt with multiple episodes of diarrhea on 4/18. C.diff +. With WBC count >15, he  was considered to have "severe" C.diff infection, and po Vancomycin was started.  - Vancomycin po 125mg  QID x14 days, day 2/14   #Hepatitis C- Elevated AST and ALT on admission thought to be in the setting of hypotension and active GI bleed and was transient as mild liver injury.However, newly diagnosed w/ Hep C, so LFTs may be chronically elevated. LFTs are improving, ALT 139-->104 --> 91, AST 166-->115 -->98. Hep B negative. Hep C genotype pending. Per Dr. Benson Norway, he needs to apply for Medicaid which will provide coverage for the Hep C therapy. F/u with him in 2 weeks.  -CMP in the AM  - F/u Hep C genotype  - SW consult to help start [place]   This is a Careers information officer Note.  The care of the patient was discussed with Dr. Mechele Claude and the assessment and plan formulated with their assistance.  Please see their attached note for official documentation of the daily encounter.   LOS: 4 days   Durene Fruits, Med Student 02/01/2014, 8:51 AM

## 2014-02-01 NOTE — Care Management Note (Signed)
    Page 1 of 1   02/01/2014     5:26:31 PM CARE MANAGEMENT NOTE 02/01/2014  Patient:  Amsler,Juelz   Account Number:  [id number]  Date Initiated:  02/01/2014  Documentation initiated by:  Tomi Bamberger  Subjective/Objective Assessment:   dx gib, hematemesis  admit- lives with spouse. pta indep.     Action/Plan:   Anticipated DC Date:  02/01/2014   Anticipated DC Plan:  [place]  CM consult  [facility]      Choice offered to / List presented to:             Status of service:  Completed, signed off Medicare Important Message given?   (If response is "NO", the following Medicare IM given date fields will be blank) Date Medicare IM given:   Date Additional Medicare IM given:    Discharge Disposition:  HOME/SELF CARE  Per UR Regulation:  Reviewed for med. necessity/level of care/duration of stay  If discussed at [place] of Stay Meetings, dates discussed:    Comments:  02/01/14 [place] ,BSN [phone] patient lives with spouse, patient states need ast with medications, NCM ast patient with [facility] and informed patient this is only time he can use this ast this year.  Also [facility] at [facility].  NCM faxed vanc script to [facility], they filled prescription and patient is to go there and pick it up.

## 2014-02-03 NOTE — Discharge Summary (Signed)
Reviewed note. Agree with documentation.

## 2014-02-05 LAB — HCV RNA QUANT
HCV QUANT: 1280381 [IU]/mL — AB (ref <15)
HCV Quantitative Log: 6.11 {Log} — ABNORMAL HIGH (ref <1.18)

## 2014-02-08 LAB — HEPATITIS C GENOTYPE: HCV Genotype: 3

## 2014-02-09 ENCOUNTER — Telehealth: Payer: Self-pay | Admitting: *Deleted

## 2014-02-09 ENCOUNTER — Ambulatory Visit (HOSPITAL_BASED_OUTPATIENT_CLINIC_OR_DEPARTMENT_OTHER): Payer: Medicaid Other | Admitting: Internal Medicine

## 2014-02-09 ENCOUNTER — Encounter (HOSPITAL_COMMUNITY): Payer: Self-pay | Admitting: Emergency Medicine

## 2014-02-09 ENCOUNTER — Emergency Department (HOSPITAL_COMMUNITY): Payer: Medicaid Other

## 2014-02-09 ENCOUNTER — Inpatient Hospital Stay (HOSPITAL_COMMUNITY)
Admission: EM | Admit: 2014-02-09 | Discharge: 2014-02-12 | DRG: 433 | Disposition: A | Discharge status: Home / Self Care | Payer: Medicaid Other | Attending: Internal Medicine | Admitting: Internal Medicine

## 2014-02-09 ENCOUNTER — Encounter: Payer: Self-pay | Admitting: Internal Medicine

## 2014-02-09 VITALS — BP 122/75 | HR 97 | Temp 99.3°F | Resp 20 | Ht 71.0 in | Wt 235.6 lb

## 2014-02-09 DIAGNOSIS — K7460 Unspecified cirrhosis of liver: Principal | ICD-10-CM | POA: Diagnosis present

## 2014-02-09 DIAGNOSIS — K766 Portal hypertension: Secondary | ICD-10-CM | POA: Diagnosis present

## 2014-02-09 DIAGNOSIS — F17200 Nicotine dependence, unspecified, uncomplicated: Secondary | ICD-10-CM | POA: Diagnosis present

## 2014-02-09 DIAGNOSIS — E8779 Other fluid overload: Secondary | ICD-10-CM | POA: Diagnosis not present

## 2014-02-09 DIAGNOSIS — R7401 Elevation of levels of liver transaminase levels: Secondary | ICD-10-CM | POA: Diagnosis present

## 2014-02-09 DIAGNOSIS — D62 Acute posthemorrhagic anemia: Secondary | ICD-10-CM | POA: Diagnosis present

## 2014-02-09 DIAGNOSIS — F1010 Alcohol abuse, uncomplicated: Secondary | ICD-10-CM | POA: Diagnosis present

## 2014-02-09 DIAGNOSIS — R161 Splenomegaly, not elsewhere classified: Secondary | ICD-10-CM | POA: Diagnosis present

## 2014-02-09 DIAGNOSIS — D696 Thrombocytopenia, unspecified: Secondary | ICD-10-CM | POA: Diagnosis present

## 2014-02-09 DIAGNOSIS — K769 Liver disease, unspecified: Secondary | ICD-10-CM | POA: Diagnosis present

## 2014-02-09 DIAGNOSIS — Z09 Encounter for follow-up examination after completed treatment for conditions other than malignant neoplasm: Secondary | ICD-10-CM | POA: Insufficient documentation

## 2014-02-09 DIAGNOSIS — R160 Hepatomegaly, not elsewhere classified: Secondary | ICD-10-CM

## 2014-02-09 DIAGNOSIS — M7989 Other specified soft tissue disorders: Secondary | ICD-10-CM

## 2014-02-09 DIAGNOSIS — N5089 Other specified disorders of the male genital organs: Secondary | ICD-10-CM | POA: Diagnosis not present

## 2014-02-09 DIAGNOSIS — E8809 Other disorders of plasma-protein metabolism, not elsewhere classified: Secondary | ICD-10-CM | POA: Diagnosis present

## 2014-02-09 DIAGNOSIS — C220 Liver cell carcinoma: Secondary | ICD-10-CM | POA: Diagnosis present

## 2014-02-09 DIAGNOSIS — R931 Abnormal findings on diagnostic imaging of heart and coronary circulation: Secondary | ICD-10-CM | POA: Diagnosis not present

## 2014-02-09 DIAGNOSIS — B1920 Unspecified viral hepatitis C without hepatic coma: Secondary | ICD-10-CM

## 2014-02-09 DIAGNOSIS — I8510 Secondary esophageal varices without bleeding: Secondary | ICD-10-CM | POA: Diagnosis present

## 2014-02-09 DIAGNOSIS — K922 Gastrointestinal hemorrhage, unspecified: Secondary | ICD-10-CM

## 2014-02-09 DIAGNOSIS — K319 Disease of stomach and duodenum, unspecified: Secondary | ICD-10-CM | POA: Diagnosis present

## 2014-02-09 DIAGNOSIS — R9431 Abnormal electrocardiogram [ECG] [EKG]: Secondary | ICD-10-CM | POA: Diagnosis not present

## 2014-02-09 DIAGNOSIS — A0472 Enterocolitis due to Clostridium difficile, not specified as recurrent: Secondary | ICD-10-CM | POA: Diagnosis present

## 2014-02-09 DIAGNOSIS — R188 Other ascites: Secondary | ICD-10-CM | POA: Diagnosis present

## 2014-02-09 DIAGNOSIS — Z720 Tobacco use: Secondary | ICD-10-CM

## 2014-02-09 DIAGNOSIS — E8770 Fluid overload, unspecified: Secondary | ICD-10-CM

## 2014-02-09 DIAGNOSIS — I8500 Esophageal varices without bleeding: Secondary | ICD-10-CM | POA: Diagnosis present

## 2014-02-09 DIAGNOSIS — R740 Nonspecific elevation of levels of transaminase and lactic acid dehydrogenase [LDH]: Secondary | ICD-10-CM

## 2014-02-09 HISTORY — DX: Esophageal varices without bleeding: I85.00

## 2014-02-09 HISTORY — DX: Unspecified cirrhosis of liver: K74.60

## 2014-02-09 HISTORY — DX: Other bacterial infections of unspecified site: A49.8

## 2014-02-09 HISTORY — DX: Unspecified viral hepatitis C without hepatic coma: B19.20

## 2014-02-09 HISTORY — DX: Liver cell carcinoma: C22.0

## 2014-02-09 LAB — COMPREHENSIVE METABOLIC PANEL
ALBUMIN: 2.6 g/dL — AB (ref 3.5–5.2)
ALK PHOS: 150 U/L — AB (ref 39–117)
ALT: 107 U/L — ABNORMAL HIGH (ref 0–53)
AST: 130 U/L — ABNORMAL HIGH (ref 0–37)
BUN: 13 mg/dL (ref 6–23)
CHLORIDE: 104 meq/L (ref 96–112)
CO2: 21 mEq/L (ref 19–32)
CREATININE: 0.85 mg/dL (ref 0.50–1.35)
Calcium: 8 mg/dL — ABNORMAL LOW (ref 8.4–10.5)
GFR calc Af Amer: >90 mL/min (ref >90)
GFR calc non Af Amer: >90 mL/min (ref >90)
GLUCOSE: 83 mg/dL (ref 70–99)
Potassium: 4.1 mEq/L (ref 3.7–5.3)
Sodium: 136 mEq/L — ABNORMAL LOW (ref 137–147)
Total Bilirubin: 1.2 mg/dL (ref 0.3–1.2)
Total Protein: 7 g/dL (ref 6.0–8.3)

## 2014-02-09 LAB — TROPONIN I: Troponin I: <0.3 ng/mL (ref <0.30)

## 2014-02-09 LAB — CBC
HEMATOCRIT: 32 % — AB (ref 39.0–52.0)
Hemoglobin: 10.7 g/dL — ABNORMAL LOW (ref 13.0–17.0)
MCH: 31.4 pg (ref 26.0–34.0)
MCHC: 33.4 g/dL (ref 30.0–36.0)
MCV: 93.8 fL (ref 78.0–100.0)
Platelets: 105 10*3/uL — ABNORMAL LOW (ref 150–400)
RBC: 3.41 MIL/uL — ABNORMAL LOW (ref 4.22–5.81)
RDW: 16.8 % — AB (ref 11.5–15.5)
WBC: 8.1 10*3/uL (ref 4.0–10.5)

## 2014-02-09 LAB — PROTIME-INR
INR: 1.39 (ref 0.00–1.49)
PROTHROMBIN TIME: 16.7 s — AB (ref 11.6–15.2)

## 2014-02-09 LAB — I-STAT TROPONIN, ED: TROPONIN I, POC: 0.02 ng/mL (ref 0.00–0.08)

## 2014-02-09 LAB — APTT: aPTT: 35 seconds (ref 24–37)

## 2014-02-09 LAB — PRO B NATRIURETIC PEPTIDE: Pro B Natriuretic peptide (BNP): 42.9 pg/mL (ref 0–125)

## 2014-02-09 MED ORDER — VANCOMYCIN HCL 125 MG PO CAPS: 125.0000 mg | ORAL_CAPSULE | Freq: Four times a day (QID) | ORAL | Status: DC

## 2014-02-09 MED ORDER — NICOTINE 21 MG/24HR TD PT24
21.0000 mg | MEDICATED_PATCH | Freq: Every day | TRANSDERMAL | Status: DC
  Filled 2014-02-09: qty 1

## 2014-02-09 MED ORDER — VANCOMYCIN 50 MG/ML ORAL SOLUTION
125.0000 mg | Freq: Four times a day (QID) | ORAL | Status: DC
  Administered 2014-02-10 – 2014-02-12 (×10): 125 mg via ORAL
  Filled 2014-02-09 (×14): qty 2.5

## 2014-02-09 MED ORDER — SPIRONOLACTONE 100 MG PO TABS
100.0000 mg | ORAL_TABLET | Freq: Every day | ORAL | Status: DC
  Filled 2014-02-09: qty 1

## 2014-02-09 MED ORDER — FUROSEMIDE 40 MG PO TABS
40.0000 mg | ORAL_TABLET | Freq: Every day | ORAL | Status: DC
  Filled 2014-02-09: qty 1

## 2014-02-09 NOTE — ED Notes (Signed)
[facility], was dc from hospital one week ago for liver failure and varices. Now has severe swelling to abd and down into legs. Having sob, which is more severe when lying down in bed. Airway intact at triage, ekg being done.

## 2014-02-09 NOTE — H&P (Signed)
Date: 02/09/2014               Patient Name:  George Fuller MRN: [id number]  DOB: 03/12/1956 Age / Sex: 58 y.o., male   PCP: George Manning, NP         Medical Service: Internal Medicine Teaching Service         Attending Physician: Dr. Madilyn Fireman, MD    First Contact: Dr. Mechele Fuller Pager: [phone]  Second Contact: Dr. Eulas Fuller  Pager: [phone]       After Hours (After 5p/  First Contact Pager: [phone]  weekends / holidays): Second Contact Pager: [phone]   Chief Complaint: bilateral leg swelling, scrotal swelling, weight gain  History of Present Illness:  George Fuller is a 58 year old man with history of hepatitis C cirrhosis c/b esophageal varices s/p banding x7, C. difficile colitis (discharged 02/01/14) who presents with bilateral lower extremity and scrotal edema, weight gain.   Patient states he developed bilateral leg swelling on Saturday 4/25 which has been increasing over the past several days and now up to the level of his scrotum.  He has been trying to elevate his legs with little relief.  He did not want to have to come back to the hospital so did not do so until today when he was sent to the ED following a PCP appointment at [facility].  He feels that his abdomen is distended compared to prior to recent hospitalization but actually feels it is less distended than it was then (due to C. Diff diarrhea).  He does think he has gained 15-20 pounds since hospital discharge on 4/20.  Patient also reports that when he tries to lie down to sleep, he experiences dry cough which then makes him feel short of breath.  He does not feel short of breath otherwise and has been taking walks at home in his yard.    Patient reports that he is having 2-3 BMs per day now which are brown, loose but not diarrhea.  All abdominal pain and cramping has resolved since continuing PO vancomycin therapy.  No hematemesis or melena since discharge.  No fever, chest pain, abdominal pain,  lightheadedness/syncope, confusion.    Meds: No current facility-administered medications for this encounter.   Current Outpatient Prescriptions  Medication Sig Dispense Refill  . oxyCODONE (OXY IR/ROXICODONE) 5 MG immediate release tablet Take 1 tablet (5 mg total) by mouth every 4 (four) hours as needed for severe pain.  30 tablet  0  . vancomycin (VANCOCIN HCL) 125 MG capsule Take 1 capsule (125 mg total) by mouth 4 (four) times daily.  52 capsule  0    Allergies: Allergies as of 02/09/2014  . (No Known Allergies)   Past Medical History  Diagnosis Date  . Varices, esophageal   . Clostridium difficile infection    Past Surgical History  Procedure Laterality Date  . Esophagogastroduodenoscopy N/A 01/28/2014    Procedure: ESOPHAGOGASTRODUODENOSCOPY (EGD);  Surgeon: George Beams, MD;  Location: [facility] ENDOSCOPY;  Service: Endoscopy;  Laterality: N/A;   Family History  Problem Relation Age of Onset  . COPD Mother   . Alcohol abuse Father    History   Social History  . Marital Status: Married    Spouse Name: N/A    Number of Children: N/A  . Years of Education: N/A   Occupational History  . Not on file.   Social History Main Topics  . Smoking status: Smoker, Current Status Unknown -- 1.00  packs/day for 30 years    Types: Cigarettes  . Smokeless tobacco: Not on file  . Alcohol Use: No  . Drug Use: No  . Sexual Activity: Not on file   Other Topics Concern  . Not on file   Social History Narrative  . No narrative on file  Quit smoking at his last hospitalization, social EtOH but none since prior to last hospitalization, no illicits  Review of Systems: Review of Systems  Constitutional: Negative for fever, chills and malaise/fatigue.  Respiratory: Positive for cough. Negative for shortness of breath.   Cardiovascular: Positive for orthopnea and leg swelling. Negative for chest pain and palpitations.  Gastrointestinal: Negative for nausea, vomiting, abdominal pain,  diarrhea, constipation, blood in stool and melena.  Genitourinary: Negative for dysuria, frequency and hematuria.       Scrotal and penis swelling  Musculoskeletal: Negative for falls and myalgias.  Neurological: Negative for dizziness, loss of consciousness and headaches.  Psychiatric/Behavioral: The patient has insomnia.     Physical Exam: Blood pressure 113/75, pulse 91, temperature 98.4 F (36.9 C), temperature source Oral, resp. rate 21, height 5' 11"  (1.803 m), weight 236 lb (107.049 kg), SpO2 96.00%. General: alert, cooperative, and in no apparent distress HEENT: NCAT, vision grossly intact, oropharynx clear and non-erythematous  Neck: supple, no lymphadenopathy Lungs: clear to ascultation bilaterally, normal work of respiration, no wheezes, rales, ronchi Heart: regular rate and rhythm, loud S1 with 2/6 systolic murmur, no gallops or rubs Abdomen: abdomen distended with likely ascites, + bs, non-tender, no rebound or guarding Extremities: 3+ pitting edema of BLE to thighs with significant scrotal and penile edema (TTP), 2+ DP/PT pulses bilaterally, no cyanosis, clubbing Neurologic: alert & oriented X3, cranial nerves II-XII intact, strength grossly intact, sensation intact to light touch Skin: no stigmata of cirrhosis  Lab results: Basic Metabolic Panel:  Recent Labs  02/09/14 1749  NA 136*  K 4.1  CL 104  CO2 21  GLUCOSE 83  BUN 13  CREATININE 0.85  CALCIUM 8.0*  Corrected Ca 9.1 (within normal limits)  Liver Function Tests:  Recent Labs  02/09/14 1749  AST 130*  ALT 107*  ALKPHOS 150*  BILITOT 1.2  PROT 7.0  ALBUMIN 2.6*   CBC:  Recent Labs  02/09/14 1747  WBC 8.1  HGB 10.7*  HCT 32.0*  MCV 93.8  PLT 105*   Cardiac Enzymes:  Recent Labs  02/09/14 1749  TROPONINI <0.30   BNP:  Recent Labs  02/09/14 1749  PROBNP 42.9   Coagulation:  Recent Labs  02/09/14 1749  LABPROT 16.7*  INR 1.39    Imaging results:  Dg Chest 2  View  02/09/2014   CLINICAL DATA:  Bilateral leg swelling  EXAM: CHEST  2 VIEW  COMPARISON:  DG CHEST 1V PORT dated 01/28/2014  FINDINGS: The lungs are hypoinflated. There is no focal infiltrate. The interstitial markings are mildly prominent bilaterally. The cardiopericardial silhouette is top-normal in size. The pulmonary vascularity is not engorged. The mediastinum is normal in width. No pleural effusion is demonstrated. There is no pneumothorax.  Within the upper abdomen there are loops of minimally distended gas-filled small bowel in the epigastrium and air and fluid within the hepatic flexure of the colon.  IMPRESSION: There is bilateral pulmonary hypoinflation. There is no definite evidence of active cardiopulmonary disease. Within the upper abdomen the gas pattern is nonspecific but may reflect an ileus or gastroenteritis.   Electronically Signed   By: Brave  [place]   On:  02/09/2014 19:22    Assessment & Plan by Problem: #Cirrhosis (2/2 hepatitis C) with new onset ascites- Patient presented with bilateral lower extremity swelling up to scrotum and weight gain.  VSS; on chart review, patient has gained ~36 pounds since last hospital discahrge.  Admission Hgb stable at 10.7, thrombocytopenia with PLT 105 (but improved from 67 of 4/20).  LFTs slightly more elevated than on 4/20 discharge: alk phos 105 --> 150, AST 72 --> 130, ALT 77 --> 107.  Hypoalbuminemia, stable at 2.6 (2.3 on 4/20).  CT abdomen on 4/16 showed cirrhotic liver with no hyper-enhancing mass to suggest hepatoma, small amount of ascites, mild splenomegaly and esophageal varices.  Current ascites likely 2/2 hypoalbuminemia vs. portal hypertension; heart failure less likely given negative CXR, clear lungs on exam, proBNP wnl, but could consider echo.   However, given new onset ascites with significant weight gain, concern for hepatic veno-occlusive disease.  Very little concern for SBP given no abdominal pain, fever in addition to above labs;  also little concern for hepatorenal syndrome given excellent kidney function (Cr 0.85, GFR >90 on admission).  Patient also not encephalopathic at this time.  INR 1.39, troponin x 1 negative.  -admit to IMTS med-surg -consider GI consult in AM  -abdominal ultrasound with doppler -Lasix 40 mg PO daily, spironolactone 100 mg daily; will titrate as BP allows -consider paracentesis in AM with SAAG gradient  -elevate scrotum -fluid, sodium restricted diet  -Is/Os, daily weights -CMP, Mg, Phos, CBC in AM   #Hepatitis C- new diagnosis last admission, HCV genotype 3. Hep B negative, HIV nonreactive last admission.  Patient in process of applying for Medicaid to cover treatment.    #History of C. Diff colitis- Currently undergoing vancomycin PO therapy (ending 5/1).  Symptoms now resolved- no abdominal pain/cramping, stools loose but no diarrhea.   -continue vancomycin PO 125 QID  #Diet- fluid (1.2L), sodium restricted (2g) diet  #DVT PPX- SCDs for now  #Code status- Full code (discussed with patient during admission interview)  Dispo: Disposition is deferred at this time, awaiting improvement of current medical problems. Anticipated discharge in approximately 2-3 day(s).   The patient does have a current PCP George Manning, NP) and does need an [facility] hospital follow-up appointment after discharge.   Signed: Ivin Poot, MD 02/09/2014, 7:50 PM

## 2014-02-09 NOTE — Patient Instructions (Signed)
Edema Edema is an abnormal build-up of fluids in tissues. Because this is partly dependent on gravity (water flows to the lowest place), it is more common in the legs and thighs (lower extremities). It is also common in the looser tissues, like around the eyes. Painless swelling of the feet and ankles is common and increases as a person ages. It may affect both legs and may include the calves or even thighs. When squeezed, the fluid may move out of the affected area and may leave a dent for a few moments. CAUSES   Prolonged standing or sitting in one place for extended periods of time. Movement helps pump tissue fluid into the veins, and absence of movement prevents this, resulting in edema.  Varicose veins. The valves in the veins do not work as well as they should. This causes fluid to leak into the tissues.  Fluid and salt overload.  Injury, burn, or surgery to the leg, ankle, or foot, may damage veins and allow fluid to leak out.  Sunburn damages vessels. Leaky vessels allow fluid to go out into the sunburned tissues.  Allergies (from insect bites or stings, medications or chemicals) cause swelling by allowing vessels to become leaky.  Protein in the blood helps keep fluid in your vessels. Low protein, as in malnutrition, allows fluid to leak out.  Hormonal changes, including pregnancy and menstruation, cause fluid retention. This fluid may leak out of vessels and cause edema.  Medications that cause fluid retention. Examples are sex hormones, blood pressure medications, steroid treatment, or anti-depressants.  Some illnesses cause edema, especially heart failure, kidney disease, or liver disease.  Surgery that cuts veins or lymph nodes, such as surgery done for the heart or for breast cancer, may result in edema. DIAGNOSIS  Your caregiver is usually easily able to determine what is causing your swelling (edema) by simply asking what is wrong (getting a history) and examining you (doing  a physical). Sometimes x-rays, EKG (electrocardiogram or heart tracing), and blood work may be done to evaluate for underlying medical illness. TREATMENT  General treatment includes:  Leg elevation (or elevation of the affected body part).  Restriction of fluid intake.  Prevention of fluid overload.  Compression of the affected body part. Compression with elastic bandages or support stockings squeezes the tissues, preventing fluid from entering and forcing it back into the blood vessels.  Diuretics (also called water pills or fluid pills) pull fluid out of your body in the form of increased urination. These are effective in reducing the swelling, but can have side effects and must be used only under your caregiver's supervision. Diuretics are appropriate only for some types of edema. The specific treatment can be directed at any underlying causes discovered. Heart, liver, or kidney disease should be treated appropriately. HOME CARE INSTRUCTIONS   Elevate the legs (or affected body part) above the level of the heart, while lying down.  Avoid sitting or standing still for prolonged periods of time.  Avoid putting anything directly under the knees when lying down, and do not wear constricting clothing or garters on the upper legs.  Exercising the legs causes the fluid to work back into the veins and lymphatic channels. This may help the swelling go down.  The pressure applied by elastic bandages or support stockings can help reduce ankle swelling.  A low-salt diet may help reduce fluid retention and decrease the ankle swelling.  Take any medications exactly as prescribed. SEEK MEDICAL CARE IF:  Your edema is   not responding to recommended treatments. SEEK IMMEDIATE MEDICAL CARE IF:   You develop shortness of breath or chest pain.  You cannot breathe when you lay down; or if, while lying down, you have to get up and go to the window to get your breath.  You are having increasing  swelling without relief from treatment.  You develop a fever over 102 F (38.9 C).  You develop pain or redness in the areas that are swollen.  Tell your caregiver right away if you have gained 03 lb/1.4 kg in 1 day or 05 lb/2.3 kg in a week. MAKE SURE YOU:   Understand these instructions.  Will watch your condition.  Will get help right away if you are not doing well or get worse. Document Released: 10/01/2005 Document Revised: 04/01/2012 Document Reviewed: 05/19/2008 [employer] Patient Information 2014 [employer].  

## 2014-02-09 NOTE — ED Provider Notes (Signed)
CSN: [id number]     Arrival date & time 02/09/14  1726 History   First MD Initiated Contact with Patient 02/09/14 1803     Chief Complaint  Patient presents with  . Shortness of Breath  . Leg Swelling     (Consider location/radiation/quality/duration/timing/severity/associated sxs/prior Treatment) Patient is a 58 y.o. male presenting with shortness of breath. The history is provided by the patient and the spouse.  Shortness of Breath  patient here with increased abdominal distention as well as shortness of breath. He describes orthopnea and dyspnea on exertion. No fever but some cough. Was admitted to the hospital last week for liver failure secondary to hepatitis C. Went to the clinic today for followup and sent here for further evaluation. He notes increased lower extremity edema. Symptoms have been worsening. Nothing makes them better. No medications used prior to arrival. He denies any vomiting or diarrhea. No black or bloody stools. Symptoms are worse at night.  Past Medical History  Diagnosis Date  . Varices, esophageal   . Clostridium difficile infection    Past Surgical History  Procedure Laterality Date  . Esophagogastroduodenoscopy N/A 01/28/2014    Procedure: ESOPHAGOGASTRODUODENOSCOPY (EGD);  Surgeon: Beryle Beams, MD;  Location: [facility] ENDOSCOPY;  Service: Endoscopy;  Laterality: N/A;   Family History  Problem Relation Age of Onset  . COPD Mother   . Alcohol abuse Father    History  Substance Use Topics  . Smoking status: Smoker, Current Status Unknown -- 1.00 packs/day for 30 years    Types: Cigarettes  . Smokeless tobacco: Not on file  . Alcohol Use: No    Review of Systems  Respiratory: Positive for shortness of breath.   All other systems reviewed and are negative.     Allergies  Review of patient's allergies indicates no known allergies.  Home Medications   Prior to Admission medications   Medication Sig Start Date End Date Taking? Authorizing  Provider  oxyCODONE (OXY IR/ROXICODONE) 5 MG immediate release tablet Take 1 tablet (5 mg total) by mouth every 4 (four) hours as needed for severe pain. 02/01/14   Rebecca Eaton, MD  vancomycin (VANCOCIN HCL) 125 MG capsule Take 1 capsule (125 mg total) by mouth 4 (four) times daily. 02/01/14   Rebecca Eaton, MD   BP 108/70  Pulse 90  Temp(Src) 98.4 F (36.9 C) (Oral)  Resp 21  Ht 5\' 11"  (1.803 m)  Wt 236 lb (107.049 kg)  BMI 32.93 kg/m2  SpO2 96% Physical Exam  Vitals reviewed. Constitutional: He is oriented to person, place, and time. He appears well-developed and well-nourished.  Non-toxic appearance. No distress.  HENT:  Head: Normocephalic and atraumatic.  Eyes: Conjunctivae, EOM and lids are normal. Pupils are equal, round, and reactive to light.  No scleral icterus  Neck: Normal range of motion. Neck supple. No tracheal deviation present. No mass present.  Cardiovascular: Normal rate, regular rhythm and normal heart sounds.  Exam reveals no gallop.   No murmur heard. Pulmonary/Chest: Effort normal. No stridor. No respiratory distress. He has decreased breath sounds. He has no wheezes. He has rhonchi. He has no rales.  Abdominal: Soft. Normal appearance and bowel sounds are normal. He exhibits distension and ascites. There is generalized tenderness. There is no rigidity, no rebound, no guarding and no CVA tenderness.  Musculoskeletal: Normal range of motion. He exhibits no edema and no tenderness.  3 plus bilateral le edema  Neurological: He is alert and oriented to person, place, and time. He  has normal strength. No cranial nerve deficit or sensory deficit. GCS eye subscore is 4. GCS verbal subscore is 5. GCS motor subscore is 6.  Skin: Skin is warm and dry. No abrasion and no rash noted.  Psychiatric: He has a normal mood and affect. His speech is normal and behavior is normal.    ED Course  Procedures (including critical care time) Labs Review Labs Reviewed  CBC -  Abnormal; Notable for the following:    RBC 3.41 (*)    Hemoglobin 10.7 (*)    HCT 32.0 (*)    RDW 16.8 (*)    Platelets 105 (*)    All other components within normal limits  PRO B NATRIURETIC PEPTIDE  COMPREHENSIVE METABOLIC PANEL  TROPONIN I  PROTIME-INR  APTT  I-STAT TROPOININ, ED    Imaging Review No results found.   EKG Interpretation None      MDM   Final diagnoses:  None    Patient to be admitted to have a paracentesis and further management of his cirrhosis.    Leota Jacobsen, MD 02/09/14 [phone]

## 2014-02-09 NOTE — Progress Notes (Addendum)
Patient ID: Marcas Bowsher, male   DOB: [birth year], 58 y.o.   MRN: [id number]    LZJ:[id number]  KWI:[id number]  DOB - May 28, 1956  CC:  Chief Complaint  Patient presents with  . Hospitalization Follow-up  . GI Problem  . Groin Swelling  . Leg Swelling       HPI: Temesgen Weightman is a 58 y.o. male here today for a hospital followup of GI bleeding. Patient was diagnosed with hepatitis C and esophageal varices while hospitalized.  On the day of discharge (April 16) patient noticed bilateral lower extremity swelling. He reports increased bilateral lower extremity edema ranging up to his thighs and scrotum.  He has noticed increased shortness of breath and abdominal distention since discharge. He reports significant weight gain since discharge. He reports increasing heaviness of his scrotum and pressure in his lower abdomen. Patient was also discharged with C. Diff.  He has 2 more days of vancomycin to complete. He reports improvement of diarrhea and abdominal pain. He is now having only 3 episodes of loose diarrhea per day without blood.   No Known Allergies Past Medical History  Diagnosis Date  . Varices, esophageal   . Clostridium difficile infection    Current Outpatient Prescriptions on File Prior to Visit  Medication Sig Dispense Refill  . oxyCODONE (OXY IR/ROXICODONE) 5 MG immediate release tablet Take 1 tablet (5 mg total) by mouth every 4 (four) hours as needed for severe pain.  30 tablet  0  . vancomycin (VANCOCIN HCL) 125 MG capsule Take 1 capsule (125 mg total) by mouth 4 (four) times daily.  52 capsule  0   No current facility-administered medications on file prior to visit.   Family History  Problem Relation Age of Onset  . COPD Mother   . Alcohol abuse Father    History   Social History  . Marital Status: Married    Spouse Name: N/A    Number of Children: N/A  . Years of Education: N/A   Occupational History  . Not on file.   Social History Main Topics  . Smoking  status: Smoker, Current Status Unknown -- 1.00 packs/day for 30 years    Types: Cigarettes  . Smokeless tobacco: Not on file  . Alcohol Use: No  . Drug Use: No  . Sexual Activity: Not on file   Other Topics Concern  . Not on file   Social History Narrative  . No narrative on file    Review of Systems: Constitutional: Negative for fever, chills, diaphoresis, activity change, appetite change and fatigue. HENT: Negative for ear pain, nosebleeds, congestion, facial swelling, rhinorrhea, neck pain, neck stiffness and ear discharge.  Eyes: Negative for pain, discharge, redness, itching and visual disturbance. Respiratory: Negative for choking, chest tightness, wheezing and stridor. Positive for shortness of breath, cough, orthopnea. Cardiovascular: Negative for chest pain, palpitations. Positive for bilateral leg swelling Gastrointestinal: Positive for abdominal distention, diarrhea Genitourinary: Negative for dysuria, urgency, frequency, hematuria, flank pain, decreased urine volume, difficulty urinating and dyspareunia.  Musculoskeletal: Negative for back pain, joint swelling, arthralgia and gait problem. Neurological: Negative for dizziness, tremors, seizures, syncope, facial asymmetry, speech difficulty, weakness, light-headedness, numbness and headaches.  Hematological: Negative for adenopathy. Does not bruise/bleed easily. Psychiatric/Behavioral: Negative for hallucinations, behavioral problems, confusion, dysphoric mood, decreased concentration and agitation.  Genitalia: Positive for scrotal and penis edema  Objective:   Filed Vitals:   02/09/14 1611  BP: 122/75  Pulse: 97  Temp: 99.3 F (37.4 C)  Resp: 20  Physical Exam: Constitutional: Patient appears well-developed and well-nourished. No distress. HENT: Normocephalic, atraumatic, External right and left ear normal. Oropharynx is clear and moist.  Eyes: Conjunctivae and EOM are normal. PERRLA, no scleral icterus. Neck:  Normal ROM. Neck supple. No JVD. No tracheal deviation. No thyromegaly. CVS: RRR, S1/S2 +, no murmurs, no gallops, no carotid bruit.  Pulmonary: Increased work of breathing, positive crackles throughout.   Abdominal: Positive abdominal distention and ascites. Mildly tender to palpitation  Musculoskeletal: Normal range of motion. 3+ bilateral lower extremity edema  Lymphadenopathy: No lymphadenopathy noted, cervical Neuro: Alert. Normal reflexes, muscle tone coordination. No cranial nerve deficit. Skin: Skin is warm and dry. No rash noted. Not diaphoretic. No erythema. No pallor. Psychiatric: Normal mood and affect. Behavior, judgment, thought content normal. Genitals: 3+ scrotal edema, tender to touch  Lab Results  Component Value Date   WBC 8.1 02/09/2014   HGB 10.7* 02/09/2014   HCT 32.0* 02/09/2014   MCV 93.8 02/09/2014   PLT 105* 02/09/2014   Lab Results  Component Value Date   CREATININE 0.85 02/09/2014   BUN 13 02/09/2014   NA 136* 02/09/2014   K 4.1 02/09/2014   CL 104 02/09/2014   CO2 21 02/09/2014    No results found for this basename: HGBA1C   Lipid Panel  No results found for this basename: chol, trig, hdl, cholhdl, vldl, ldlcalc       Assessment and plan:   Gearold was seen today for hospitalization follow-up, gi problem, groin swelling and leg swelling.  Diagnoses and associated orders for this visit:  Fluid overload Patient Hospital records reviewed and advised to transfer to ED for aggressive diuresis and further workup. Patient has had 30 pound weight gain since April 16. Patient given instructions to followup upon hospital discharge.  Scrotal edema   Chari Manning, NP-C [facility] [phone] 02/09/2014, 7:22 PM

## 2014-02-09 NOTE — ED Notes (Signed)
Admit MD at bedside

## 2014-02-09 NOTE — Progress Notes (Signed)
HFU gastro surgery Patient here to establish care Patient states he began having painful swelling in groin and lower extremities starting at discharge from Hospital. Patient c/o shortness of breath as well

## 2014-02-09 NOTE — ED Notes (Signed)
Pt is transfer from [facility].  Pt d/c'd last Tuesday s/p esophageal banding for esophageal varices.  Pt has swelling from the abdomen down.  16 lb weight gain.  Graham reports crackles in lungs R > L.

## 2014-02-10 ENCOUNTER — Inpatient Hospital Stay (HOSPITAL_COMMUNITY): Payer: Medicaid Other

## 2014-02-10 DIAGNOSIS — A0472 Enterocolitis due to Clostridium difficile, not specified as recurrent: Secondary | ICD-10-CM

## 2014-02-10 LAB — COMPREHENSIVE METABOLIC PANEL
ALT: 93 U/L — ABNORMAL HIGH (ref 0–53)
AST: 114 U/L — ABNORMAL HIGH (ref 0–37)
Albumin: 2.2 g/dL — ABNORMAL LOW (ref 3.5–5.2)
Alkaline Phosphatase: 121 U/L — ABNORMAL HIGH (ref 39–117)
BUN: 15 mg/dL (ref 6–23)
CALCIUM: 7.8 mg/dL — AB (ref 8.4–10.5)
CO2: 25 mEq/L (ref 19–32)
CREATININE: 0.84 mg/dL (ref 0.50–1.35)
Chloride: 104 mEq/L (ref 96–112)
GFR calc non Af Amer: >90 mL/min (ref >90)
GLUCOSE: 116 mg/dL — AB (ref 70–99)
Potassium: 3.9 mEq/L (ref 3.7–5.3)
SODIUM: 136 meq/L — AB (ref 137–147)
TOTAL PROTEIN: 6.1 g/dL (ref 6.0–8.3)
Total Bilirubin: 1.3 mg/dL — ABNORMAL HIGH (ref 0.3–1.2)

## 2014-02-10 LAB — BODY FLUID CELL COUNT WITH DIFFERENTIAL
Lymphs, Fluid: 21 %
Monocyte-Macrophage-Serous Fluid: 63 % (ref 50–90)
Neutrophil Count, Fluid: 16 % (ref 0–25)
Total Nucleated Cell Count, Fluid: 87 cu mm (ref 0–1000)

## 2014-02-10 LAB — LACTATE DEHYDROGENASE, PLEURAL OR PERITONEAL FLUID: LD, Fluid: 38 U/L — ABNORMAL HIGH (ref 3–23)

## 2014-02-10 LAB — LIPID PANEL
Cholesterol: 64 mg/dL (ref 0–200)
HDL: 23 mg/dL — AB (ref >39)
LDL Cholesterol: 31 mg/dL (ref 0–99)
Total CHOL/HDL Ratio: 2.8 RATIO
Triglycerides: 50 mg/dL (ref <150)
VLDL: 10 mg/dL (ref 0–40)

## 2014-02-10 LAB — GLUCOSE, PERITONEAL FLUID: Glucose, Peritoneal Fluid: 91 mg/dL

## 2014-02-10 LAB — CBC
HCT: 28.7 % — ABNORMAL LOW (ref 39.0–52.0)
HEMOGLOBIN: 9.5 g/dL — AB (ref 13.0–17.0)
MCH: 30.9 pg (ref 26.0–34.0)
MCHC: 33.1 g/dL (ref 30.0–36.0)
MCV: 93.5 fL (ref 78.0–100.0)
Platelets: 98 10*3/uL — ABNORMAL LOW (ref 150–400)
RBC: 3.07 MIL/uL — AB (ref 4.22–5.81)
RDW: 16.7 % — ABNORMAL HIGH (ref 11.5–15.5)
WBC: 6.8 10*3/uL (ref 4.0–10.5)

## 2014-02-10 LAB — MAGNESIUM: Magnesium: 1.9 mg/dL (ref 1.5–2.5)

## 2014-02-10 LAB — ALBUMIN, FLUID (OTHER): Albumin, Fluid: 0.2 g/dL

## 2014-02-10 LAB — TROPONIN I

## 2014-02-10 LAB — PROTEIN, TOTAL: TOTAL PROTEIN: 6.1 g/dL (ref 6.0–8.3)

## 2014-02-10 LAB — PHOSPHORUS: Phosphorus: 3.3 mg/dL (ref 2.3–4.6)

## 2014-02-10 MED ORDER — NICOTINE 21 MG/24HR TD PT24
21.0000 mg | MEDICATED_PATCH | TRANSDERMAL | Status: DC
  Administered 2014-02-10 – 2014-02-11 (×2): 21 mg via TRANSDERMAL
  Filled 2014-02-10 (×4): qty 1

## 2014-02-10 MED ORDER — SPIRONOLACTONE 100 MG PO TABS
100.0000 mg | ORAL_TABLET | Freq: Every day | ORAL | Status: DC
  Administered 2014-02-10 – 2014-02-12 (×3): 100 mg via ORAL
  Filled 2014-02-10 (×3): qty 1

## 2014-02-10 MED ORDER — FUROSEMIDE 40 MG PO TABS
40.0000 mg | ORAL_TABLET | Freq: Every day | ORAL | Status: DC
  Administered 2014-02-10 – 2014-02-12 (×3): 40 mg via ORAL
  Filled 2014-02-10 (×3): qty 1

## 2014-02-10 NOTE — Progress Notes (Signed)
Subjective: Patient feels as though his leg swelling has improved somewhat with leg elevation overnight. Still with significant scrotal pressure 2/2 swelling. Still having ~3 episodes of soft stool (not loose or watery) per day, has been compliant with vancomycin PO since last discharge.   Objective: Vital signs in last 24 hours: Filed Vitals:   02/09/14 2130 02/09/14 2145 02/09/14 2225 02/10/14 0615  BP: 123/93 112/74 127/69 99/63  Pulse: 97 94 93 93  Temp:   98.7 F (37.1 C) 98.6 F (37 C)  TempSrc:   Oral Oral  Resp: 22 24 19 17   Height:      Weight:   235 lb 9.6 oz (106.867 kg)   SpO2: 95% 95% 98% 94%   Weight change:  Weight 236 on admission-->235lbs today  Intake/Output Summary (Last 24 hours) at 02/10/14 0716 Last data filed at 02/10/14 0321  Gross per 24 hour  Intake    480 ml  Output    250 ml  Net    230 ml   Physical Exam General: alert, cooperative, and in no apparent distress; comfortably lying in bed HEENT: NCAT, vision grossly intact, oropharynx clear and non-erythematous; no scleral icterus  Neck: supple Lungs: clear to ascultation bilaterally, normal WOB Heart: RRR Abdomen: abdomen distended with fluid wave; normal bowel sounds, nontender Extremities: 2+ pitting edema of BLE feet to knee, with 1+ pitting edema to thighs and trace edema to lower abddomen; significant scrotal and penile edema (wearing tight briefs) Neurologic: alert & oriented X3, cranial nerves II-XII grossly intact, strength grossly intact, sensation intact to light touch; no asterixis   Lab Results: Basic Metabolic Panel:  Recent Labs Lab 02/09/14 02/09/14 1749 02/10/14  NA  --  136* 136*  K  --  4.1 3.9  CL  --  104 104  CO2  --  21 25  GLUCOSE  --  83 116*  BUN  --  13 15  CREATININE  --  0.85 0.84  CALCIUM  --  8.0* 7.8*  MG 1.9  --   --   PHOS 3.3  --   --    Liver Function Tests:  Recent Labs Lab 02/09/14 1749 02/10/14  AST 130* 114*  ALT 107* 93*  ALKPHOS 150*  121*  BILITOT 1.2 1.3*  PROT 7.0 6.1  ALBUMIN 2.6* 2.2*   CBC:  Recent Labs Lab 02/09/14 1747 02/10/14  WBC 8.1 6.8  HGB 10.7* 9.5*  HCT 32.0* 28.7*  MCV 93.8 93.5  PLT 105* 98*   Cardiac Enzymes:  Recent Labs Lab 02/09/14 1749  TROPONINI <0.30   BNP:  Recent Labs Lab 02/09/14 1749  PROBNP 42.9   Coagulation:  Recent Labs Lab 02/09/14 1749  LABPROT 16.7*  INR 1.39   Studies/Results: Dg Chest 2 View  02/09/2014   CLINICAL DATA:  Bilateral leg swelling  EXAM: CHEST  2 VIEW  COMPARISON:  DG CHEST 1V PORT dated 01/28/2014  FINDINGS: The lungs are hypoinflated. There is no focal infiltrate. The interstitial markings are mildly prominent bilaterally. The cardiopericardial silhouette is top-normal in size. The pulmonary vascularity is not engorged. The mediastinum is normal in width. No pleural effusion is demonstrated. There is no pneumothorax.  Within the upper abdomen there are loops of minimally distended gas-filled small bowel in the epigastrium and air and fluid within the hepatic flexure of the colon.  IMPRESSION: There is bilateral pulmonary hypoinflation. There is no definite evidence of active cardiopulmonary disease. Within the upper abdomen the gas pattern  is nonspecific but may reflect an ileus or gastroenteritis.   Electronically Signed   By: Kairav  [place]   On: 02/09/2014 19:22   Medications: I have reviewed the patient's current medications. Scheduled Meds: . furosemide  40 mg Oral Daily  . nicotine  21 mg Transdermal Q24H  . spironolactone  100 mg Oral Daily  . vancomycin  125 mg Oral 4 times per day   Continuous Infusions:  PRN Meds:.  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 Acute hepatic veno-occlusive disease; much less likely CHF (CXR wnl, proBNP wnl, lungs CTAB). Patient with persistent leg swelling and abdomen does look more distended than at last hospital discharge. Had gained  approx 30lbs since his last hospital discharge, today weight is down 1lb though did not receive diuretics yet this admission. VSS overnight. -will give lasix 40mg  daily and spironolacton 100mg  daily starting today -continue leg and scrotal elevation -strict I/Os, daily weights -will consult Dr. Benson Norway -abdominal ultrasound with doppler  -consider paracentesis -fluid, sodium restricted diet   #Hepatitis C- new diagnosis last admission, HCV genotype 3. Hep B negative, HIV nonreactive last admission. Patient in process of applying for Medicaid to cover treatment. Dr. Benson Norway will be consulted.  #History of C. Diff colitis- Still with approx 3 stools per day, which is more than his baseline, though stool is firming up and no more abd pain. Continuing vancomycin PO therapy (last day 5/1).  -continue vancomycin PO 125 QID through 5/1  #Diet- fluid (1.2L), sodium restricted (2g) diet   #DVT PPX- SCDs for now given recent GI bleed  #Code status- Full code   Dispo: Disposition is deferred at this time, awaiting improvement of current medical problems.  Anticipated discharge in approximately 2 day(s).   The patient does have a current PCP Chari Manning, NP) and does not need an [facility] hospital follow-up appointment after discharge.  The patient does not have transportation limitations that hinder transportation to clinic appointments.  .Services Needed at time of discharge: Y = Yes, Blank = No PT:   OT:   RN:   Equipment:   Other:     LOS: 1 day   Rebecca Eaton, MD 02/10/2014, 7:16 AM

## 2014-02-10 NOTE — Procedures (Addendum)
Successful US guided paracentesis from RUQ.  Yielded 5.3 liters of clear yellow fluid.  No immediate complications.  Pt tolerated well.   Specimen was sent for labs.  Hedy Jacob PA-C 02/10/2014 4:28 PM

## 2014-02-10 NOTE — Progress Notes (Signed)
Utilization review completed.  

## 2014-02-10 NOTE — Discharge Summary (Signed)
Name: George Fuller MRN: [id number] DOB: 12-06-1955 58 y.o. 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 difficile diarrhea   Liver mass 2.9 cm on [place] 01/2014 most consistent with [facility]    Esophageal varices   Abnormal Q waves on electrocardigram   Tobacco abuse   Discharge Medications:   Medication List    STOP taking these medications       vancomycin 125 MG capsule  Commonly known as:  VANCOCIN HCL      TAKE these medications       furosemide 40 MG tablet  Commonly known as:  LASIX  Take 1 tablet (40 mg total) by mouth daily.     oxyCODONE 5 MG immediate release tablet  Commonly known as:  Oxy IR/ROXICODONE  Take 1 tablet (5 mg total) by mouth every 4 (four) hours as needed for severe pain.     spironolactone 100 MG tablet  Commonly known as:  ALDACTONE  Take 1 tablet (100 mg total) by mouth daily.        Disposition and follow-up:   Mr.George Fuller was discharged from [facility] in Stable condition.  At the hospital follow up visit please address:  1.  Resolution of ascites, follow-up with Dr. Benson Norway for likely [facility]  2.  Labs / imaging needed at time of follow-up: CMP  3.  Pending labs/ test needing follow-up: None  Follow-up Appointments: Follow-up Information   Follow up with [facility]    . (will call you with an appointment in the next 1-2 weeks if not call them)    Contact information:   [place] [id number] [phone]      Follow up with Beryle Beams, MD. (02/15/14 10:30 am)    Specialty:  Gastroenterology   Contact information:   [address] [place] [address] [phone]       Follow up with [facility] . (02/16/14 at  9:45 am-Bring $65 for copay for visit )    Contact information:   Dr. Crisoforo Oxford  [facility] (will be in front of cancer center)  [facility]  [phone] (option 1) Fax # [phone]       Follow up with [facility].   Specialty:  General Practice   Contact information:   [place] [address] [phone]       Discharge Instructions: Discharge Orders   Future Orders Complete By Expires   Call MD for:  persistant nausea and vomiting  As directed    Call MD for:  severe uncontrolled pain  As directed    Call MD for:  temperature >100.4  As directed    Diet - low sodium heart healthy  As directed    Increase activity slowly  As directed       Consultations: Treatment Team:  Beryle Beams, MD [facility], MD  Procedures Performed:  Dg Chest 2 View  02/09/2014   CLINICAL DATA:  Bilateral leg swelling  EXAM: CHEST  2 VIEW  COMPARISON:  DG CHEST 1V PORT dated 01/28/2014  FINDINGS: The lungs are hypoinflated. There is no focal infiltrate. The interstitial markings are mildly prominent bilaterally. The cardiopericardial silhouette is top-normal in size.  The pulmonary vascularity is not engorged. The mediastinum is normal in width. No pleural effusion is demonstrated. There is no pneumothorax.  Within the upper abdomen there are loops of minimally distended gas-filled small bowel in the epigastrium and air and fluid within the hepatic flexure of the colon.  IMPRESSION: There is bilateral pulmonary hypoinflation. There is no definite evidence of active cardiopulmonary disease. Within the upper abdomen the gas pattern is nonspecific but may reflect an ileus or gastroenteritis.   Electronically Signed   By: Standley  Martinique   On: 02/09/2014 19:22   Ct Abdomen Pelvis W Contrast  01/28/2014   CLINICAL DATA:  Throwing up blood, lower abdominal pain  EXAM: CT ABDOMEN AND PELVIS WITH CONTRAST  TECHNIQUE: Multidetector CT imaging of the abdomen and  pelvis was performed using the standard protocol following bolus administration of intravenous contrast.  CONTRAST:  170m OMNIPAQUE IOHEXOL 300 MG/ML  SOLN  COMPARISON:  None.  FINDINGS: There is bilateral mild chronic interstitial disease, right greater than left. There are esophageal varices noted. There is coronary artery atherosclerosis involving the left main, LAD, circumflex and right coronary artery.  The liver is diminutive in size with a micronodular contour more consistent with cirrhosis. There is no intrahepatic or extrahepatic biliary ductal dilatation. There is severe gallbladder wall thickening. There is a small amount air within the gallbladder. There is mild splenomegaly. The kidneys, adrenal glands and pancreas are normal. The bladder is unremarkable.  There is no bowel dilatation to suggest obstruction. There is a small amount of abdominal ascites. There is bowel wall thickening involving the small bowel and colon as can be seen with a hypoalbuminemic and cirrhotic state. There is no pneumoperitoneum, pneumatosis, or portal venous gas. There is no abdominal or pelvic free fluid. There is no lymphadenopathy.  The abdominal aorta is normal in caliber with atherosclerosis.  There are no lytic or sclerotic osseous lesions. There is degenerative disc disease at L3-4, L4-5 and L5-S1.  IMPRESSION: 1. Cirrhotic liver.  No hyperenhancing mass to suggest a hepatoma. 2. Small amount of abdominal ascites with bowel wall thickening involving the small bowel and colon as can be seen in the setting of hypoalbuminemia and cirrhosis versus enterocolitis. 3. Gallbladder wall thickening likely related to hepatocellular disease. 4. Small amount of air within the gallbladder which may be secondary to recent instrumentation. In the absence of recent instrumentation the appearance is concerning for infection. 5. Mild splenomegaly and esophageal varices.   Electronically Signed   By: HKathreen Devoid  On: 01/28/2014 22:48     Dg Chest Portable 1 View  01/28/2014   CLINICAL DATA:  GI bleed  EXAM: PORTABLE CHEST - 1 VIEW  COMPARISON:  None.  FINDINGS: Mild/vague right lower lobe opacity is favored to reflect scarring/atelectasis. Lungs are otherwise clear. No pleural effusion or pneumothorax.  The heart is normal in size.  Enteric tube coursing into the stomach.  IMPRESSION: No evidence of acute cardiopulmonary disease.   Electronically Signed   By: SJulian HyM.D.   On: 01/28/2014 10:12   02/12/2014 2D Echo: -Study Conclusions - Left ventricle: The cavity size was normal. Wall thickness was normal. The estimated ejection fraction was 55%. Although no diagnostic regional wall motion abnormality was identified, this possibility cannot be completely excluded on the basis of this study. Doppler parameters are consistent with abnormal left ventricular relaxation (grade 1 diastolic dysfunction). - Aortic valve: Trileaflet; moderately calcified leaflets. There was no stenosis. - Mitral valve: Mildly calcified annulus.  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 No significant regurgitation. - Left atrium: The atrium was mildly dilated. - Right ventricle: The cavity size was normal. Systolic function was normal. - Tricuspid valve: Peak RV-RA gradient: 17m Hg (S). - Pulmonary arteries: PA peak pressure: 388mHg (S). - Systemic veins: IVC measured 2.3 cm with < 50% respirophasic variation, suggesting RA pressure 15 mmHg. - Pericardium, extracardiac: There was a pleural effusion. Impressions: - Normal LV size and systolic function, EF 5562%There was an extensive mass of calcification attached to the anterior mitral leaflet and extending towards the aortic valve.Normal RV size and systolic function.   Admission HPI:  Mr. HaParkersons a [age]ear old man with history of hepatitis C cirrhosis c/b esophageal varices s/p banding x7, C.  difficile colitis (discharged 02/01/14) who presents with bilateral lower extremity and scrotal edema, weight gain.  Patient states he developed bilateral leg swelling on Saturday 4/25 which has been increasing over the past several days and now up to the level of his scrotum. He has been trying to elevate his legs with little relief. He did not want to have to come back to the hospital so did not do so until today when he was sent to the ED [place] that [place] abdomen is distended compared to prior to recent hospitalization but actually feels it is less distended than it was then (due to C. Diff diarrhea). He does think he has gained 15-20 pounds since hospital discharge on 4/20. Patient also reports that when he tries to lie down to sleep, he experiences dry cough which then makes him feel short of breath. He does not feel short of breath otherwise and has been taking walks at home in his yard.  Patient reports that he is having 2-3 BMs per day now which are brown, loose but not diarrhea. All abdominal pain and cramping has resolved since continuing PO vancomycin therapy.  No hematemesis or melena since discharge. No fever, chest pain, abdominal pain, lightheadedness/syncope, confusion.   Hospital Course by problem list:   #Cirrhosis (2/2 hepatitis C) with new onset ascites- Patient presented with bilateral lower extremity, abdominal distension, swelling of penis and scrotum and weight gain of 30lbs since last hospital discharge. VSS. Admission Hgb stable at 10.7, thrombocytopenia with PLT 105 (but improved from 67 of 4/20). LFTs slightly more elevated than on 4/20 discharge: alk phos 105 --> 150, AST 72 --> 130, ALT 77 --> 107 (LFTs down trended slightly during his hospitalization though were mostly stable). Hypoalbuminemia, stable at 2.6 (2.3 on 4/20). CT abdomen on 4/16 showed cirrhotic liver with no hyper-enhancing mass to suggest hepatoma,  small amount of ascites, mild splenomegaly and esophageal varices. Volume overload thought to be 2/2 hypoalbuminemia vs. portal hypertension. Heart failure was considered, though much less likely given negative CXR, clear lungs on exam, proBNP wnl, and echo performed on 4/30 showed LVEF 55%. Abd ultrasound was performed and showed moderate ascites, so paracentesis was performed and ~5L ascitic fluid removed. Ascitic fluid labs unrevealing, no concern for SBP. No evidence of thrombosis on [place], there is a new isoechogenic mass in liver (of note AFP slightly elevated a 12.3 on last admission). GI was consulted and an abdominal MRI was performed and showed advanced cirrhosis with 2.5cm lesion in liver [facility].  Additionally, there was dilation  of the portal vein and splenomegaly.  Dr. Benson Norway took patient for repeat EGD on 5/1 which non-bleeding varices which were banded. Patient was diuresed with lasix 45m PO and spironolactone 1064mdaily with good response.  He has close follow-up with Dr. HuBenson Norway  #New Q waves on EKG- Patient with persistent q waves in leads II, III, aVF which are new since his last hospitalization earlier this month. Troponins were cycled twice this hospital stay and all negative. Dr. NiJohnsie Cancelcardiology) saw patient and did not think this was silent MI. Pt with loud systolic murmur, so 2D Echo was performed which showed grade 1 diastolic dysfunction.  Of note, lipid panel this admission showed Total cholesterol 64, TG 50, HDL 23, LDL 31.   #Hepatitis C- New diagnosis last admission, HCV genotype 3. Hep B negative, HIV nonreactive last admission. Patient in process of applying for Medicaid to cover treatment. Patient was given GI follow up at discharge. Dr. HuBenson Norwaylans to treat.  #History of C. Diff colitis- Patient with some continued soft stools at hospital admission, though improved during his hospitalization. He finished a full 14  day course of PO vancomycin (last day 5/1).  Discharge Vitals:   BP 123/78  Pulse 100  Temp(Src) 97.8 F (36.6 C) (Oral)  Resp 18  Ht 5' 11"  (1.803 m)  Wt 228 lb (103.42 kg)  BMI 31.81 kg/m2  SpO2 94%  Discharge Labs:  No results found for this or any previous visit (from the past 24 hour(s)).  Signed: JaJones BalesMD 02/15/2014, 1:03 PM   Time Spent on Discharge: 35 minutes Services Ordered on Discharge: 45  Equipment Ordered on Discharge: None

## 2014-02-10 NOTE — Consult Note (Addendum)
Unassigned patient  Reason for Consult: Hepatitis C with anasarca.  Referring Physician: IMTS  George Fuller is an 58 y.o. male.  HPI: 58 year old white male, recently discharged from the hospital after having esophageal banding for a GI bleed and Clostridium Difficile colitis that responded well to Vancomycin. He has recently been diagnosed with Hepatitis C. He admits to drinking alcohol but feels it is "not much". He comes in with worsening lowe extremity edema extending up to the abdomen and with worsening abdominal distension. He denies having any melena or hematochezia. He denies having any abdominal pain, nausea or vomiting. There is no history of fever, chills or rigors. He has had a lot of scrotal edema with blisters on his feet.    Past Medical History  Diagnosis Date  . Varices, esophageal   . Clostridium difficile infection    Past Surgical History  Procedure Laterality Date  . Esophagogastroduodenoscopy N/A 01/28/2014    Procedure: ESOPHAGOGASTRODUODENOSCOPY (EGD);  Surgeon: Beryle Beams, MD;  [facility] ENDOSCOPY;  Service: Endoscopy;  Laterality: N/A;   Family History  Problem Relation Age of Onset  . COPD Mother   . Alcohol abuse Father    Social History:  reports that he has been smoking Cigarettes.  He has a 30 pack-year smoking history. He does not have any smokeless tobacco history on file. He reports that he does not drink alcohol or use illicit drugs.  Allergies: No Known Allergies  Medications: I have reviewed the patient's current medications.  Results for orders placed during the hospital encounter of 02/09/14 (from the past 48 hour(s))  MAGNESIUM     Status: None   Collection Time    02/09/14 12:00 AM      Result Value Ref Range   Magnesium 1.9  1.5 - 2.5 mg/dL  PHOSPHORUS     Status: None   Collection Time    02/09/14 12:00 AM      Result Value Ref Range   Phosphorus 3.3  2.3 - 4.6 mg/dL  CBC     Status: Abnormal   Collection Time    02/09/14  5:47  PM      Result Value Ref Range   WBC 8.1  4.0 - 10.5 K/uL   RBC 3.41 (*) 4.22 - 5.81 MIL/uL   Hemoglobin 10.7 (*) 13.0 - 17.0 g/dL   HCT 32.0 (*) 39.0 - 52.0 %   MCV 93.8  78.0 - 100.0 fL   MCH 31.4  26.0 - 34.0 pg   MCHC 33.4  30.0 - 36.0 g/dL   RDW 16.8 (*) 11.5 - 15.5 %   Platelets 105 (*) 150 - 400 K/uL   Comment: CONSISTENT WITH PREVIOUS RESULT  PRO B NATRIURETIC PEPTIDE     Status: None   Collection Time    02/09/14  5:49 PM      Result Value Ref Range   Pro B Natriuretic peptide (BNP) 42.9  0 - 125 pg/mL  COMPREHENSIVE METABOLIC PANEL     Status: Abnormal   Collection Time    02/09/14  5:49 PM      Result Value Ref Range   Sodium 136 (*) 137 - 147 mEq/L   Potassium 4.1  3.7 - 5.3 mEq/L   Chloride 104  96 - 112 mEq/L   CO2 21  19 - 32 mEq/L   Glucose, Bld 83  70 - 99 mg/dL   BUN 13  6 - 23 mg/dL   Creatinine, Ser 0.85  0.50 -  1.35 mg/dL   Calcium 8.0 (*) 8.4 - 10.5 mg/dL   Total Protein 7.0  6.0 - 8.3 g/dL   Albumin 2.6 (*) 3.5 - 5.2 g/dL   AST 130 (*) 0 - 37 U/L   ALT 107 (*) 0 - 53 U/L   Alkaline Phosphatase 150 (*) 39 - 117 U/L   Total Bilirubin 1.2  0.3 - 1.2 mg/dL   GFR calc non Af Amer >90  >90 mL/min   GFR calc Af Amer >90  >90 mL/min   Comment: (NOTE)     The eGFR has been calculated using the CKD EPI equation.     This calculation has not been validated in all clinical situations.     eGFR's persistently <90 mL/min signify possible Chronic Kidney     Disease.  TROPONIN I     Status: None   Collection Time    02/09/14  5:49 PM      Result Value Ref Range   Troponin I <0.30  <0.30 ng/mL   Comment:            Due to the release kinetics of cTnI,     a negative result within the first hours     of the onset of symptoms does not rule out     myocardial infarction with certainty.     If myocardial infarction is still suspected,     repeat the test at appropriate intervals.  PROTIME-INR     Status: Abnormal   Collection Time    02/09/14  5:49 PM       Result Value Ref Range   Prothrombin Time 16.7 (*) 11.6 - 15.2 seconds   INR 1.39  0.00 - 1.49  APTT     Status: None   Collection Time    02/09/14  5:49 PM      Result Value Ref Range   aPTT 35  24 - 37 seconds  I-STAT TROPOININ, ED     Status: None   Collection Time    02/09/14  5:57 PM      Result Value Ref Range   Troponin i, poc 0.02  0.00 - 0.08 ng/mL   Comment 3            Comment: Due to the release kinetics of cTnI,     a negative result within the first hours     of the onset of symptoms does not rule out     myocardial infarction with certainty.     If myocardial infarction is still suspected,     repeat the test at appropriate intervals.  COMPREHENSIVE METABOLIC PANEL     Status: Abnormal   Collection Time    02/10/14 12:00 AM      Result Value Ref Range   Sodium 136 (*) 137 - 147 mEq/L   Potassium 3.9  3.7 - 5.3 mEq/L   Chloride 104  96 - 112 mEq/L   CO2 25  19 - 32 mEq/L   Glucose, Bld 116 (*) 70 - 99 mg/dL   BUN 15  6 - 23 mg/dL   Creatinine, Ser 0.84  0.50 - 1.35 mg/dL   Calcium 7.8 (*) 8.4 - 10.5 mg/dL   Total Protein 6.1  6.0 - 8.3 g/dL   Albumin 2.2 (*) 3.5 - 5.2 g/dL   AST 114 (*) 0 - 37 U/L   ALT 93 (*) 0 - 53 U/L   Alkaline Phosphatase 121 (*) 39 - 117 U/L  Total Bilirubin 1.3 (*) 0.3 - 1.2 mg/dL   GFR calc non Af Amer >90  >90 mL/min   GFR calc Af Amer >90  >90 mL/min   Comment: (NOTE)     The eGFR has been calculated using the CKD EPI equation.     This calculation has not been validated in all clinical situations.     eGFR's persistently <90 mL/min signify possible Chronic Kidney     Disease.  CBC     Status: Abnormal   Collection Time    02/10/14 12:00 AM      Result Value Ref Range   WBC 6.8  4.0 - 10.5 K/uL   RBC 3.07 (*) 4.22 - 5.81 MIL/uL   Hemoglobin 9.5 (*) 13.0 - 17.0 g/dL   HCT 28.7 (*) 39.0 - 52.0 %   MCV 93.5  78.0 - 100.0 fL   MCH 30.9  26.0 - 34.0 pg   MCHC 33.1  30.0 - 36.0 g/dL   RDW 16.7 (*) 11.5 - 15.5 %   Platelets  98 (*) 150 - 400 K/uL   Comment: CONSISTENT WITH PREVIOUS RESULT   Dg Chest 2 View  02/09/2014   CLINICAL DATA:  Bilateral leg swelling  EXAM: CHEST  2 VIEW  COMPARISON:  DG CHEST 1V PORT dated 01/28/2014  FINDINGS: The lungs are hypoinflated. There is no focal infiltrate. The interstitial markings are mildly prominent bilaterally. The cardiopericardial silhouette is top-normal in size. The pulmonary vascularity is not engorged. The mediastinum is normal in width. No pleural effusion is demonstrated. There is no pneumothorax.  Within the upper abdomen there are loops of minimally distended gas-filled small bowel in the epigastrium and air and fluid within the hepatic flexure of the colon.  IMPRESSION: There is bilateral pulmonary hypoinflation. There is no definite evidence of active cardiopulmonary disease. Within the upper abdomen the gas pattern is nonspecific but may reflect an ileus or gastroenteritis.   Electronically Signed   By: Keshawn  Martinique   On: 02/09/2014 19:22   Review of Systems  Constitutional: Negative for fever, chills, weight loss, malaise/fatigue and diaphoresis.  HENT: Negative.   Respiratory: Negative.   Cardiovascular: Positive for leg swelling.  Gastrointestinal: Negative for heartburn, nausea, vomiting, abdominal pain, diarrhea, constipation, blood in stool and melena.  Genitourinary: Negative.   Skin: Negative.   Neurological: Positive for weakness.  Endo/Heme/Allergies: Bruises/bleeds easily.  Psychiatric/Behavioral: Negative.    Blood pressure 124/72, pulse 87, temperature 98.6 F (37 C), temperature source Oral, resp. rate 20, height 5' 11"  (1.803 m), weight 106.867 kg (235 lb 9.6 oz), SpO2 95.00%. Physical Exam  Constitutional: He is oriented to person, place, and time. He appears well-developed and well-nourished.  HENT:  Head: Normocephalic and atraumatic.  Eyes: Conjunctivae and EOM are normal. Pupils are equal, round, and reactive to light.  Neck: Normal range  of motion. Neck supple.  Cardiovascular: Normal rate and regular rhythm.   Respiratory: Effort normal and breath sounds normal.  GI: Soft. Bowel sounds are normal. He exhibits distension. He exhibits no mass. There is no tenderness. There is no rebound and no guarding.  Distended with ascites  Musculoskeletal: He exhibits edema and tenderness.  Patient has anasarca with 2 + pitting edema from the feet to the lower abdomen    Neurological: He is alert and oriented to person, place, and time.  Skin: Skin is warm and dry.  Psychiatric: He has a normal mood and affect. His behavior is normal. Judgment and thought content normal.  Assessment/Plan: 1) Hepatitis C with esophageal varices and anasarca and ascites-paracentesis done today. He has been started on diuretics. He will also need to be on a beta blocker. Dr. Benson Norway to see in AM. 2) 2.9 cm isoechoic liver mass on ultrasound [not seen on recent CT]: He will need a AFP level checked along with an MRI of the liver to rule out [facility] in the setting of cirrhosis.  Juanita Craver 02/10/2014, 3:34 PM

## 2014-02-10 NOTE — Progress Notes (Signed)
New Admission:  Arrival Method: stretcher Mental Orientation: AOx4 Telemetry: na Assessment: completed Skin: intact IV: nsl Pain:denies Tubes: na Safety Measures: initiated Admission:completed [address] [place] Orientation:completed Family:na  Orders have been reviewed and implemented. Will continue to monitor the patient. Call light has been placed within reach and bed alarm has been activated.  [employer] BSN, RN-BC [phone]

## 2014-02-10 NOTE — H&P (Signed)
  Date: 02/10/2014  Patient name: George Fuller  Medical record number: [id number]  Date of birth: 04/13/1956   I have seen and evaluated Park Breed and discussed their care with the Residency Team. Mr Lacock was D/C'd 02/01/14 after admission for UGI bleed 2/2 varices, new dx cirrhosis, and new dx of Hep C. He also developed C diff during that H but is responding to PO Vanc. He has had 4 days or so of increasing LE edema that progressed to scrotum. Also caused blisters L foot. Abd decreased in size but changed in appearance and now a "beach ball." He was ambulating well. He has no CP, SOB, or abd pain. Does have documented weight gain of 20 lbs. His C diff has responded well and now with formed BM, 1-2 per day.   On exam, he is lying comfortable flat. No ABD tenderness. Tense ascites. He has +2 edema to knees and then +1 post upper legs. There is a small (2-3 mm) sore on L base ant and laterally of 2nd toe.   Assessment and Plan: I have seen and evaluated the patient as outlined above. I agree with the formulated Assessment and Plan as detailed in the residents' admission note, with the following changes:   1. Ascites and peripheral edema - all extravascular as pro BNP is nl. Likely 2/2 cirrhosis. Agree with lasix and spironolactone. Due to sudden onset, agree with Doppler U/S to R/o veno-occlusive disease. Also paracentesis since new onset ascites.   2. Q waves inferior leads - these are new. Trop I neg. Need to repeat the EKG today. If they are still present, will needs cards consult and medical tx - BB, ASA, statin.   3. Hep C - will need outpt referral for tx and is working on medicaid to accomplish this.   4. C diff - cont Vanc. Responding well.   Bartholomew Crews, MD 4/29/20151:49 PM

## 2014-02-11 DIAGNOSIS — R9431 Abnormal electrocardiogram [ECG] [EKG]: Secondary | ICD-10-CM | POA: Diagnosis not present

## 2014-02-11 DIAGNOSIS — R7401 Elevation of levels of liver transaminase levels: Secondary | ICD-10-CM

## 2014-02-11 DIAGNOSIS — I8500 Esophageal varices without bleeding: Secondary | ICD-10-CM | POA: Diagnosis present

## 2014-02-11 DIAGNOSIS — R931 Abnormal findings on diagnostic imaging of heart and coronary circulation: Secondary | ICD-10-CM | POA: Diagnosis not present

## 2014-02-11 DIAGNOSIS — E8779 Other fluid overload: Secondary | ICD-10-CM

## 2014-02-11 DIAGNOSIS — R160 Hepatomegaly, not elsewhere classified: Secondary | ICD-10-CM | POA: Diagnosis present

## 2014-02-11 DIAGNOSIS — R740 Nonspecific elevation of levels of transaminase and lactic acid dehydrogenase [LDH]: Secondary | ICD-10-CM

## 2014-02-11 LAB — COMPREHENSIVE METABOLIC PANEL
ALT: 80 U/L — AB (ref 0–53)
AST: 101 U/L — ABNORMAL HIGH (ref 0–37)
Albumin: 2 g/dL — ABNORMAL LOW (ref 3.5–5.2)
Alkaline Phosphatase: 111 U/L (ref 39–117)
BILIRUBIN TOTAL: 0.9 mg/dL (ref 0.3–1.2)
BUN: 15 mg/dL (ref 6–23)
CALCIUM: 7.5 mg/dL — AB (ref 8.4–10.5)
CHLORIDE: 103 meq/L (ref 96–112)
CO2: 25 meq/L (ref 19–32)
Creatinine, Ser: 0.87 mg/dL (ref 0.50–1.35)
Glucose, Bld: 105 mg/dL — ABNORMAL HIGH (ref 70–99)
Potassium: 4.1 mEq/L (ref 3.7–5.3)
SODIUM: 137 meq/L (ref 137–147)
Total Protein: 6 g/dL (ref 6.0–8.3)

## 2014-02-11 LAB — AMYLASE, PERITONEAL FLUID: Amylase, peritoneal fluid: 23 U/L

## 2014-02-11 LAB — CBC
HCT: 28.6 % — ABNORMAL LOW (ref 39.0–52.0)
HEMOGLOBIN: 9.3 g/dL — AB (ref 13.0–17.0)
MCH: 30.6 pg (ref 26.0–34.0)
MCHC: 32.5 g/dL (ref 30.0–36.0)
MCV: 94.1 fL (ref 78.0–100.0)
Platelets: 92 10*3/uL — ABNORMAL LOW (ref 150–400)
RBC: 3.04 MIL/uL — ABNORMAL LOW (ref 4.22–5.81)
RDW: 16.8 % — ABNORMAL HIGH (ref 11.5–15.5)
WBC: 7.4 10*3/uL (ref 4.0–10.5)

## 2014-02-11 LAB — TROPONIN I

## 2014-02-11 LAB — PATHOLOGIST SMEAR REVIEW

## 2014-02-11 NOTE — Progress Notes (Signed)
Subjective: Feeling better after the paracentesis.  Objective: Vital signs in last 24 hours: Temp:  [98.1 F (36.7 C)-99 F (37.2 C)] 99 F (37.2 C) (04/30 0450) Pulse Rate:  [87-99] 88 (04/30 0450) Resp:  [18-20] 19 (04/30 0450) BP: (100-124)/(53-72) 100/58 mmHg (04/30 0450) SpO2:  [95 %-97 %] 95 % (04/30 0450) Weight:  [227 lb 6.4 oz (103.148 kg)] 227 lb 6.4 oz (103.148 kg) (04/29 2003) Last BM Date: 02/10/14  Intake/Output from previous day: 04/29 0701 - 04/30 0700 In: 720 [P.O.:720] Out: 1130 [Urine:1130] Intake/Output this shift:    General appearance: alert and no distress GI: distended abdomen  Lab Results:  Recent Labs  02/09/14 1747 02/10/14 02/11/14 0149  WBC 8.1 6.8 7.4  HGB 10.7* 9.5* 9.3*  HCT 32.0* 28.7* 28.6*  PLT 105* 98* 92*   BMET  Recent Labs  02/09/14 1749 02/10/14 02/11/14 0149  NA 136* 136* 137  K 4.1 3.9 4.1  CL 104 104 103  CO2 21 25 25   GLUCOSE 83 116* 105*  BUN 13 15 15   CREATININE 0.85 0.84 0.87  CALCIUM 8.0* 7.8* 7.5*   LFT  Recent Labs  02/11/14 0149  PROT 6.0  ALBUMIN 2.0*  AST 101*  ALT 80*  ALKPHOS 111  BILITOT 0.9   PT/INR  Recent Labs  02/09/14 1749  LABPROT 16.7*  INR 1.39   Hepatitis Panel No results found for this basename: HEPBSAG, HCVAB, HEPAIGM, HEPBIGM,  in the last 72 hours C-Diff No results found for this basename: CDIFFTOX,  in the last 72 hours Fecal Lactopherrin No results found for this basename: FECLLACTOFRN,  in the last 72 hours  Studies/Results: Dg Chest 2 View  02/09/2014   CLINICAL DATA:  Bilateral leg swelling  EXAM: CHEST  2 VIEW  COMPARISON:  DG CHEST 1V PORT dated 01/28/2014  FINDINGS: The lungs are hypoinflated. There is no focal infiltrate. The interstitial markings are mildly prominent bilaterally. The cardiopericardial silhouette is top-normal in size. The pulmonary vascularity is not engorged. The mediastinum is normal in width. No pleural effusion is demonstrated. There is no  pneumothorax.  Within the upper abdomen there are loops of minimally distended gas-filled small bowel in the epigastrium and air and fluid within the hepatic flexure of the colon.  IMPRESSION: There is bilateral pulmonary hypoinflation. There is no definite evidence of active cardiopulmonary disease. Within the upper abdomen the gas pattern is nonspecific but may reflect an ileus or gastroenteritis.   Electronically Signed   By: Acie  Martinique   On: 02/09/2014 19:22   US Abdomen Complete  02/10/2014   CLINICAL DATA:  Abdominal distention.  Elevated LFTs.  EXAM: ULTRASOUND ABDOMEN COMPLETE  COMPARISON:  CT 01/28/2014  FINDINGS: Gallbladder:  Sludge noted within the gallbladder. No stones. Gallbladder wall is markedly thickened at 10 mm. Negative sonographic Murphy's.  Common bile duct:  Diameter: Normal caliber, 4 mm.  Liver:  Markedly cirrhotic liver which is shrunken and nodular. Probable focal isoechoic nodule in the dome a measures 2.9 x 2.6 x 2.5 cm. This is not definitively visualized on recent CT.  IVC:  No abnormality visualized.  Pancreas:  Not well visualized due to overlying bowel gas.  Spleen:  Splenomegaly with a craniocaudal length of 14.8 cm and a volume of 665 mL. No focal abnormality.  Right Kidney:  Length: 10.0 cm. Echogenicity within normal limits. No mass or hydronephrosis visualized.  Left Kidney:  Length: 11.3 cm. Echogenicity within normal limits. No mass or hydronephrosis visualized.  Abdominal  aorta:  No aneurysm visualized.  Other findings:  Ascites noted throughout the abdomen.  IMPRESSION: Changes of severe cirrhosis as seen on prior CT. There appears to be a solid isoechoic 2.9 cm nodule in the dome of the liver. This could be further evaluated with liver protocol MRI.  Sludge within the gallbladder. Marked gallbladder wall thickening. I favor this is most likely related to liver disease although chronic cholecystitis cannot be excluded. No stones.  Moderate upper abdominal ascites.    Electronically Signed   By: Rolm Baptise M.D.   On: 02/10/2014 16:47   [place] Art/ven Flow Abd Pelv Doppler  02/10/2014   CLINICAL DATA:  Hepatitis-C, cirrhosis and ascites.  EXAM: DUPLEX ULTRASOUND OF LIVER  TECHNIQUE: Color and duplex Doppler ultrasound was performed to evaluate the hepatic in-flow and out-flow vessels.  COMPARISON:  None.  FINDINGS: Portal Vein Velocities  Main:  26 cm/sec  Right:  29 cm/sec  Left:  22 cm/sec  Hepatic Vein Velocities  Right:  90 cm/sec  Middle:  45 cm/sec  Left:  54 cm/sec  Hepatic Artery Velocity:  72 cm/sec  Splenic Vein Velocity:  35 cm/sec  Varices: None visualized.  Ascites: Moderate ascites present.  There is no evidence of portal vein thrombosis or significant reduction of portal vein velocities. Portal waveforms are normal without pulsatility and flow direction in the portal vein is towards the liver. There is no evidence of hepatic veno-occlusive disease or splenic vein thrombosis. The spleen is at least mildly enlarged with estimated volume of 666 mL by ultrasound.  IMPRESSION: No evidence of portal vein thrombosis or significant reduction in portal vein velocities.   Electronically Signed   By: Aletta Edouard M.D.   On: 02/10/2014 16:35    Medications:  Scheduled: . furosemide  40 mg Oral Daily  . nicotine  21 mg Transdermal Q24H  . spironolactone  100 mg Oral Daily  . vancomycin  125 mg Oral 4 times per day   Continuous:   Assessment/Plan: 1) HCV cirrhosis. 2) History of esophageal varices. 3) ? Hepatoma.  4) Ascites.   He has ascites, but variceal bleed.  Currently he is on Step I diuretics and a low salt diet.  Ultrasound reveals the possibility of a hepatoma, however, it was not identified on the CT scan.  A mild elevation of the AFP was noted.  Plan: 1) Repeat EGD with banding tomorrow. 2) Continue with diuretics. 3) MRI of the liver.  LOS: 2 days   Beryle Beams 02/11/2014, 7:33 AM

## 2014-02-11 NOTE — Consult Note (Signed)
CARDIOLOGY CONSULT NOTE       Patient ID: George Fuller MRN: [id number] DOB/AGE: 1956/03/10 58 y.o.  Admit date: 02/09/2014 Referring Physician:  Ellwood Dense Primary Physician: Chari Manning, NP Primary Cardiologist:  New  Reason for Consultation: Abnormal ECG   Principal Problem:   Ascites due to liver cirrhosis Active Problems:   Tobacco abuse   Elevated transaminase level   Hepatitis C   C. difficile diarrhea   Cirrhosis of liver   Liver mass2.9 cm on [place] 01/2014   Esophageal varices   Abnormal Q waves on electrocardigram   Abnormal ECG   HPI:   58 yo with previous ETOH abuse and hepatitis C.  Admitted with volume overload and ascites. [facility] and [facility] to hospital for abdominal distension and LE edema Not clear why ECG was checked.  He has no chest pain, palpitations or syncope No previous cardiac history.  Had increasing volume overload over last few weeks.  Feels much better after paracentesis Yesterday.  Still with LE edema  According to patient this is first episode of ascites and paracentesis  Not on diuretics prior to admission.  Has had varices with banding  And recent bleed.  Also on oral vancomycin for  Cdif infection.  No prior history of MI or CAD   ROS All other systems reviewed and negative except as noted above  Past Medical History  Diagnosis Date  . Varices, esophageal   . Clostridium difficile infection     Family History  Problem Relation Age of Onset  . COPD Mother   . Alcohol abuse Father     History   Social History  . Marital Status: Married    Spouse Name: N/A    Number of Children: N/A  . Years of Education: N/A   Occupational History  . Not on file.   Social History Main Topics  . Smoking status: Smoker, Current Status Unknown -- 1.00 packs/day for 30 years    Types: Cigarettes  . Smokeless tobacco: Not on file  . Alcohol Use: No  . Drug Use: No  . Sexual Activity: Not on file   Other Topics Concern  .  Not on file   Social History Narrative  . No narrative on file    Past Surgical History  Procedure Laterality Date  . Esophagogastroduodenoscopy N/A 01/28/2014    Procedure: ESOPHAGOGASTRODUODENOSCOPY (EGD);  Surgeon: Beryle Beams, MD;  Location: [facility] ENDOSCOPY;  Service: Endoscopy;  Laterality: N/A;     . furosemide  40 mg Oral Daily  . nicotine  21 mg Transdermal Q24H  . spironolactone  100 mg Oral Daily  . vancomycin  125 mg Oral 4 times per day      Physical Exam: Blood pressure 100/58, pulse 88, temperature 99 F (37.2 C), temperature source Oral, resp. rate 19, height 5\' 11"  (1.803 m), weight 227 lb 6.4 oz (103.148 kg), SpO2 95.00%.   Affect appropriate Healthy:  appears stated age 61: normal Neck supple with no adenopathy JVP normal no bruits no thyromegaly Lungs clear with no wheezing and good diaphragmatic motion Heart:  S1/S2 3/6 SEM loudest aortic region murmur, no rub, gallop or click PMI normal Abdomen: benighn, BS positve, no tenderness, no AAA no bruit.  No HSM or HJR Distal pulses intact with no bruits Plus 2 bilateral  edema Neuro non-focal Skin warm and dry No muscular weakness   Labs:   Lab Results  Component Value Date   WBC 7.4 02/11/2014  HGB 9.3* 02/11/2014   HCT 28.6* 02/11/2014   MCV 94.1 02/11/2014   PLT 92* 02/11/2014    Recent Labs Lab 02/11/14 0149  NA 137  K 4.1  CL 103  CO2 25  BUN 15  CREATININE 0.87  CALCIUM 7.5*  PROT 6.0  BILITOT 0.9  ALKPHOS 111  ALT 80*  AST 101*  GLUCOSE 105*   Lab Results  Component Value Date   TROPONINI <0.30 02/11/2014    Lab Results  Component Value Date   CHOL 64 02/10/2014   Lab Results  Component Value Date   HDL 23* 02/10/2014   Lab Results  Component Value Date   LDLCALC 31 02/10/2014   Lab Results  Component Value Date   TRIG 50 02/10/2014   Lab Results  Component Value Date   CHOLHDL 2.8 02/10/2014   No results found for this basename: LDLDIRECT      Radiology: Dg  Chest 2 View  02/09/2014   CLINICAL DATA:  Bilateral leg swelling  EXAM: CHEST  2 VIEW  COMPARISON:  DG CHEST 1V PORT dated 01/28/2014  FINDINGS: The lungs are hypoinflated. There is no focal infiltrate. The interstitial markings are mildly prominent bilaterally. The cardiopericardial silhouette is top-normal in size. The pulmonary vascularity is not engorged. The mediastinum is normal in width. No pleural effusion is demonstrated. There is no pneumothorax.  Within the upper abdomen there are loops of minimally distended gas-filled small bowel in the epigastrium and air and fluid within the hepatic flexure of the colon.  IMPRESSION: There is bilateral pulmonary hypoinflation. There is no definite evidence of active cardiopulmonary disease. Within the upper abdomen the gas pattern is nonspecific but may reflect an ileus or gastroenteritis.   Electronically Signed   By: Lucion  Martinique   On: 02/09/2014 19:22   US Abdomen Complete  02/10/2014   CLINICAL DATA:  Abdominal distention.  Elevated LFTs.  EXAM: ULTRASOUND ABDOMEN COMPLETE  COMPARISON:  CT 01/28/2014  FINDINGS: Gallbladder:  Sludge noted within the gallbladder. No stones. Gallbladder wall is markedly thickened at 10 mm. Negative sonographic Murphy's.  Common bile duct:  Diameter: Normal caliber, 4 mm.  Liver:  Markedly cirrhotic liver which is shrunken and nodular. Probable focal isoechoic nodule in the dome a measures 2.9 x 2.6 x 2.5 cm. This is not definitively visualized on recent CT.  IVC:  No abnormality visualized.  Pancreas:  Not well visualized due to overlying bowel gas.  Spleen:  Splenomegaly with a craniocaudal length of 14.8 cm and a volume of 665 mL. No focal abnormality.  Right Kidney:  Length: 10.0 cm. Echogenicity within normal limits. No mass or hydronephrosis visualized.  Left Kidney:  Length: 11.3 cm. Echogenicity within normal limits. No mass or hydronephrosis visualized.  Abdominal aorta:  No aneurysm visualized.  Other findings:  Ascites  noted throughout the abdomen.  IMPRESSION: Changes of severe cirrhosis as seen on prior CT. There appears to be a solid isoechoic 2.9 cm nodule in the dome of the liver. This could be further evaluated with liver protocol MRI.  Sludge within the gallbladder. Marked gallbladder wall thickening. I favor this is most likely related to liver disease although chronic cholecystitis cannot be excluded. No stones.  Moderate upper abdominal ascites.   Electronically Signed   By: Rolm Baptise M.D.   On: 02/10/2014 16:47   Ct Abdomen Pelvis W Contrast  01/28/2014   CLINICAL DATA:  Throwing up blood, lower abdominal pain  EXAM: CT ABDOMEN AND PELVIS WITH  CONTRAST  TECHNIQUE: Multidetector CT imaging of the abdomen and pelvis was performed using the standard protocol following bolus administration of intravenous contrast.  CONTRAST:  164mL OMNIPAQUE IOHEXOL 300 MG/ML  SOLN  COMPARISON:  None.  FINDINGS: There is bilateral mild chronic interstitial disease, right greater than left. There are esophageal varices noted. There is coronary artery atherosclerosis involving the left main, LAD, circumflex and right coronary artery.  The liver is diminutive in size with a micronodular contour more consistent with cirrhosis. There is no intrahepatic or extrahepatic biliary ductal dilatation. There is severe gallbladder wall thickening. There is a small amount air within the gallbladder. There is mild splenomegaly. The kidneys, adrenal glands and pancreas are normal. The bladder is unremarkable.  There is no bowel dilatation to suggest obstruction. There is a small amount of abdominal ascites. There is bowel wall thickening involving the small bowel and colon as can be seen with a hypoalbuminemic and cirrhotic state. There is no pneumoperitoneum, pneumatosis, or portal venous gas. There is no abdominal or pelvic free fluid. There is no lymphadenopathy.  The abdominal aorta is normal in caliber with atherosclerosis.  There are no lytic or  sclerotic osseous lesions. There is degenerative disc disease at L3-4, L4-5 and L5-S1.  IMPRESSION: 1. Cirrhotic liver.  No hyperenhancing mass to suggest a hepatoma. 2. Small amount of abdominal ascites with bowel wall thickening involving the small bowel and colon as can be seen in the setting of hypoalbuminemia and cirrhosis versus enterocolitis. 3. Gallbladder wall thickening likely related to hepatocellular disease. 4. Small amount of air within the gallbladder which may be secondary to recent instrumentation. In the absence of recent instrumentation the appearance is concerning for infection. 5. Mild splenomegaly and esophageal varices.   Electronically Signed   By: Kathreen Devoid   On: 01/28/2014 22:48   [place] Art/ven Flow Abd Pelv Doppler  02/10/2014   CLINICAL DATA:  Hepatitis-C, cirrhosis and ascites.  EXAM: DUPLEX ULTRASOUND OF LIVER  TECHNIQUE: Color and duplex Doppler ultrasound was performed to evaluate the hepatic in-flow and out-flow vessels.  COMPARISON:  None.  FINDINGS: Portal Vein Velocities  Main:  26 cm/sec  Right:  29 cm/sec  Left:  22 cm/sec  Hepatic Vein Velocities  Right:  90 cm/sec  Middle:  45 cm/sec  Left:  54 cm/sec  Hepatic Artery Velocity:  72 cm/sec  Splenic Vein Velocity:  35 cm/sec  Varices: None visualized.  Ascites: Moderate ascites present.  There is no evidence of portal vein thrombosis or significant reduction of portal vein velocities. Portal waveforms are normal without pulsatility and flow direction in the portal vein is towards the liver. There is no evidence of hepatic veno-occlusive disease or splenic vein thrombosis. The spleen is at least mildly enlarged with estimated volume of 666 mL by ultrasound.  IMPRESSION: No evidence of portal vein thrombosis or significant reduction in portal vein velocities.   Electronically Signed   By: Aletta Edouard M.D.   On: 02/10/2014 16:35   Dg Chest Portable 1 View  01/28/2014   CLINICAL DATA:  GI bleed  EXAM: PORTABLE CHEST - 1  VIEW  COMPARISON:  None.  FINDINGS: Mild/vague right lower lobe opacity is favored to reflect scarring/atelectasis. Lungs are otherwise clear. No pleural effusion or pneumothorax.  The heart is normal in size.  Enteric tube coursing into the stomach.  IMPRESSION: No evidence of acute cardiopulmonary disease.   Electronically Signed   By: Julian Hy M.D.   On: 01/28/2014 10:12  EKG:  SR Q waves 2,3 and F  No acute ST elevation or ST changes   ASSESSMENT AND PLAN:  Abnormal ECG:  Asymptomatic with negative troponin Doubt silent MI in non diabetic Echo to assess EF and RWMA;s   Ascites:  On lasix and aldactone post paracentesis  Still with LE edema which should improve with diuretics Murmur:  Fairly loud and radiates widely.  In setting of Q waves in 2,33F need to r/o new / acute MR however murmur  Seems to radiate more toward aortic area.   Cdif:  Continue oral vancomycin   Signed: Josue Hector 02/11/2014, 9:46 AM

## 2014-02-11 NOTE — Progress Notes (Signed)
Patient refusing bed alarm, states he does not need it and that he "will not wander around."  Educated patient on reasoning for bed alarm; verbalizes understanding.  Patient stated that he will use call bell when needed.  Will continue to monitor patient.

## 2014-02-11 NOTE — Progress Notes (Signed)
Subjective: Patient feels much improved today. No more diarrhea. His leg swelling and abd distension are much improved. He is eating well. No abd pain, N/V, CP, SOB. He is s/p paracentesis, removed 5.3L.   Objective: Vital signs in last 24 hours: Filed Vitals:   02/10/14 1719 02/10/14 2003 02/11/14 0450 02/11/14 1400  BP: 111/59 112/53 100/58 128/73  Pulse: 96 99 88 85  Temp: 98.1 F (36.7 C) 98.8 F (37.1 C) 99 F (37.2 C) 98.7 F (37.1 C)  TempSrc: Oral Oral Oral Oral  Resp: 19 18 19 18   Height:      Weight:  227 lb 6.4 oz (103.148 kg)    SpO2: 96% 95% 95% 95%   Weight change: -8 lb 9.6 oz (-3.901 kg) Weight 236 on admission-->227 lbs today   Intake/Output Summary (Last 24 hours) at 02/11/14 1531 Last data filed at 02/11/14 1300  Gross per 24 hour  Intake   1542 ml  Output   1320 ml  Net    222 ml   Physical Exam General: alert, cooperative, NAD HEENT: NCAT, vision grossly intact Neck: supple Lungs: clear to ascultation bilaterally, normal WOB Heart: RRR, 3/6 systolic murmur Abdomen: abdomen protuberant but much less distended today, nontender, normal bowel sounds Extremities: 2+ pitting edema of BLE feet to midshin, though 1+ to knee bilaterally and perhaps trace pitting edema to thighs (overall much improved compared to yesterday); scrotal and penile edema still present  Neurologic: alert & oriented X3, cranial nerves II-XII grossly intact, strength grossly intact, sensation intact to light touch  Lab Results: Basic Metabolic Panel:  Recent Labs Lab 02/09/14  02/10/14 02/11/14 0149  NA  --   < > 136* 137  K  --   < > 3.9 4.1  CL  --   < > 104 103  CO2  --   < > 25 25  GLUCOSE  --   < > 116* 105*  BUN  --   < > 15 15  CREATININE  --   < > 0.84 0.87  CALCIUM  --   < > 7.8* 7.5*  MG 1.9  --   --   --   PHOS 3.3  --   --   --   < > = values in this interval not displayed. Liver Function Tests:  Recent Labs Lab 02/10/14 02/10/14 2023 02/11/14 0149    AST 114*  --  101*  ALT 93*  --  80*  ALKPHOS 121*  --  111  BILITOT 1.3*  --  0.9  PROT 6.1 6.1 6.0  ALBUMIN 2.2*  --  2.0*   CBC:  Recent Labs Lab 02/10/14 02/11/14 0149  WBC 6.8 7.4  HGB 9.5* 9.3*  HCT 28.7* 28.6*  MCV 93.5 94.1  PLT 98* 92*   Cardiac Enzymes:  Recent Labs Lab 02/09/14 1749 02/10/14 2023 02/11/14 0149  TROPONINI <0.30 <0.30 <0.30   BNP:  Recent Labs Lab 02/09/14 1749  PROBNP 42.9   Coagulation:  Recent Labs Lab 02/09/14 1749  LABPROT 16.7*  INR 1.39   Studies/Results: Dg Chest 2 View  02/09/2014   CLINICAL DATA:  Bilateral leg swelling  EXAM: CHEST  2 VIEW  COMPARISON:  DG CHEST 1V PORT dated 01/28/2014  FINDINGS: The lungs are hypoinflated. There is no focal infiltrate. The interstitial markings are mildly prominent bilaterally. The cardiopericardial silhouette is top-normal in size. The pulmonary vascularity is not engorged. The mediastinum is normal in width. No pleural effusion is  demonstrated. There is no pneumothorax.  Within the upper abdomen there are loops of minimally distended gas-filled small bowel in the epigastrium and air and fluid within the hepatic flexure of the colon.  IMPRESSION: There is bilateral pulmonary hypoinflation. There is no definite evidence of active cardiopulmonary disease. Within the upper abdomen the gas pattern is nonspecific but may reflect an ileus or gastroenteritis.   Electronically Signed   By: Walden  Martinique   On: 02/09/2014 19:22   US Abdomen Complete  02/10/2014   CLINICAL DATA:  Abdominal distention.  Elevated LFTs.  EXAM: ULTRASOUND ABDOMEN COMPLETE  COMPARISON:  CT 01/28/2014  FINDINGS: Gallbladder:  Sludge noted within the gallbladder. No stones. Gallbladder wall is markedly thickened at 10 mm. Negative sonographic Murphy's.  Common bile duct:  Diameter: Normal caliber, 4 mm.  Liver:  Markedly cirrhotic liver which is shrunken and nodular. Probable focal isoechoic nodule in the dome a measures 2.9 x 2.6  x 2.5 cm. This is not definitively visualized on recent CT.  IVC:  No abnormality visualized.  Pancreas:  Not well visualized due to overlying bowel gas.  Spleen:  Splenomegaly with a craniocaudal length of 14.8 cm and a volume of 665 mL. No focal abnormality.  Right Kidney:  Length: 10.0 cm. Echogenicity within normal limits. No mass or hydronephrosis visualized.  Left Kidney:  Length: 11.3 cm. Echogenicity within normal limits. No mass or hydronephrosis visualized.  Abdominal aorta:  No aneurysm visualized.  Other findings:  Ascites noted throughout the abdomen.  IMPRESSION: Changes of severe cirrhosis as seen on prior CT. There appears to be a solid isoechoic 2.9 cm nodule in the dome of the liver. This could be further evaluated with liver protocol MRI.  Sludge within the gallbladder. Marked gallbladder wall thickening. I favor this is most likely related to liver disease although chronic cholecystitis cannot be excluded. No stones.  Moderate upper abdominal ascites.   Electronically Signed   By: Rolm Baptise M.D.   On: 02/10/2014 16:47   [place] Art/ven Flow Abd Pelv Doppler  02/10/2014   CLINICAL DATA:  Hepatitis-C, cirrhosis and ascites.  EXAM: DUPLEX ULTRASOUND OF LIVER  TECHNIQUE: Color and duplex Doppler ultrasound was performed to evaluate the hepatic in-flow and out-flow vessels.  COMPARISON:  None.  FINDINGS: Portal Vein Velocities  Main:  26 cm/sec  Right:  29 cm/sec  Left:  22 cm/sec  Hepatic Vein Velocities  Right:  90 cm/sec  Middle:  45 cm/sec  Left:  54 cm/sec  Hepatic Artery Velocity:  72 cm/sec  Splenic Vein Velocity:  35 cm/sec  Varices: None visualized.  Ascites: Moderate ascites present.  There is no evidence of portal vein thrombosis or significant reduction of portal vein velocities. Portal waveforms are normal without pulsatility and flow direction in the portal vein is towards the liver. There is no evidence of hepatic veno-occlusive disease or splenic vein thrombosis. The spleen is at  least mildly enlarged with estimated volume of 666 mL by ultrasound.  IMPRESSION: No evidence of portal vein thrombosis or significant reduction in portal vein velocities.   Electronically Signed   By: Aletta Edouard M.D.   On: 02/10/2014 16:35   US Paracentesis  02/11/2014   CLINICAL DATA:  Hepatitis C, ascites, request for paracentesis.  EXAM: ULTRASOUND GUIDED diagnostic and therapeutic PARACENTESIS  COMPARISON:  None.  PROCEDURE: An ultrasound guided paracentesis was thoroughly discussed with the patient and questions answered. The benefits, risks, alternatives and complications were also discussed. The patient understands and  wishes to proceed with the procedure. Written consent was obtained.  Ultrasound was performed to localize and mark an adequate pocket of fluid in the right upper quadrant of the abdomen. The area was then prepped and draped in the normal sterile fashion. 1% Lidocaine was used for local anesthesia. Under ultrasound guidance a 19 gauge Yueh catheter was introduced. Paracentesis was performed. The catheter was removed and a dressing applied.  Complications: None.  FINDINGS: A total of approximately 5.3 liters of clear yellow fluid was removed. A fluid sample was sent for laboratory analysis.  IMPRESSION: Successful ultrasound guided paracentesis yielding 5.3 liters of ascites.  Read By:  Tsosie Billing PA-C   Electronically Signed   By: Maryclare Bean M.D.   On: 02/11/2014 09:23   Medications: I have reviewed the patient's current medications. Scheduled Meds: . furosemide  40 mg Oral Daily  . nicotine  21 mg Transdermal Q24H  . spironolactone  100 mg Oral Daily  . vancomycin  125 mg Oral 4 times per day   Continuous Infusions:  PRN Meds:.  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 WT down 10lbs. VSS. Abd [place] yesterday showed moderate ascites, so paracentesis was performed and ~5L ascitic fluid removed. Ascitic  fluid labs unrevealing, no concern for SBP. No evidence of thrombosis on [place]; however, there is a new isoechogenic mass in liver. Will need abd MRI to assess. AFP level slightly elevated at 12.3 during his last hospital admission last week. Dr. Benson Norway saw patient and plans to take patient for EGD tomorrow w/ repeat banding. -MRI abdomen -GI consult, appreciate recs  -EGD tomorrow -continue lasix 40mg  daily and spironolactone 100mg  daily (day 2) -continue leg and scrotal elevation -strict I/Os, daily weights -fluid, sodium restricted diet   #New Q waves on EKG- Patient with persistent q waves in leads II, III, aVF which are new since his last hospitalization earlier this month. Troponins were cycled twice this hospital stay so far and all negative. Dr. Johnsie Cancel saw patient this morning and did not think this was silent MI. He plans to obtain Echo given patient's systolic murmur. Lipid panel- Total cholesterol 64, TG 50, HDL 23, LDL 31.  -Echo today  #Hepatitis C- new diagnosis last admission, HCV genotype 3. Hep B negative, HIV nonreactive last admission. Patient in process of applying for Medicaid to cover treatment. Dr. Benson Norway following.  #History of C. Diff colitis- Diarrhea improving. No abd pain. Continuing vancomycin PO therapy (last day 5/1).  -continue vancomycin PO 125 QID through 5/1  #Diet- fluid (1.2L), sodium restricted (2g) diet   #DVT PPX- SCDs for now given recent GI bleed  #Code status- Full code   Dispo: Disposition is deferred at this time, awaiting improvement of current medical problems.  Anticipated discharge in approximately 1 day(s). Discharge possibly tomorrow depending on results of imaging.   The patient does have a current PCP Chari Manning, NP) and does not need an [facility] hospital follow-up appointment after discharge.  The patient does not have transportation limitations that hinder transportation to clinic appointments.  .Services Needed at time of discharge: Y = Yes,  Blank = No PT:   OT:   RN:   Equipment:   Other:     LOS: 2 days   Rebecca Eaton, MD 02/11/2014, 3:31 PM

## 2014-02-12 ENCOUNTER — Encounter (HOSPITAL_COMMUNITY): Payer: Medicaid Other | Admitting: Anesthesiology

## 2014-02-12 ENCOUNTER — Inpatient Hospital Stay (HOSPITAL_COMMUNITY): Payer: Medicaid Other

## 2014-02-12 ENCOUNTER — Encounter (HOSPITAL_COMMUNITY): Payer: Self-pay | Admitting: *Deleted

## 2014-02-12 ENCOUNTER — Encounter (HOSPITAL_COMMUNITY)
Admission: EM | Disposition: A | Discharge status: Home / Self Care | Payer: Self-pay | Source: Home / Self Care | Attending: Internal Medicine

## 2014-02-12 ENCOUNTER — Inpatient Hospital Stay (HOSPITAL_COMMUNITY): Payer: Medicaid Other | Admitting: Anesthesiology

## 2014-02-12 DIAGNOSIS — F17200 Nicotine dependence, unspecified, uncomplicated: Secondary | ICD-10-CM

## 2014-02-12 DIAGNOSIS — I517 Cardiomegaly: Secondary | ICD-10-CM

## 2014-02-12 DIAGNOSIS — R161 Splenomegaly, not elsewhere classified: Secondary | ICD-10-CM | POA: Diagnosis present

## 2014-02-12 DIAGNOSIS — C220 Liver cell carcinoma: Secondary | ICD-10-CM

## 2014-02-12 DIAGNOSIS — K769 Liver disease, unspecified: Secondary | ICD-10-CM

## 2014-02-12 DIAGNOSIS — I8510 Secondary esophageal varices without bleeding: Secondary | ICD-10-CM

## 2014-02-12 HISTORY — PX: ESOPHAGEAL BANDING: SHX5518

## 2014-02-12 HISTORY — DX: Liver cell carcinoma: C22.0

## 2014-02-12 HISTORY — PX: ESOPHAGOGASTRODUODENOSCOPY: SHX5428

## 2014-02-12 LAB — CBC
HEMATOCRIT: 29.8 % — AB (ref 39.0–52.0)
Hemoglobin: 9.6 g/dL — ABNORMAL LOW (ref 13.0–17.0)
MCH: 30.6 pg (ref 26.0–34.0)
MCHC: 32.2 g/dL (ref 30.0–36.0)
MCV: 94.9 fL (ref 78.0–100.0)
Platelets: 87 10*3/uL — ABNORMAL LOW (ref 150–400)
RBC: 3.14 MIL/uL — AB (ref 4.22–5.81)
RDW: 16.7 % — ABNORMAL HIGH (ref 11.5–15.5)
WBC: 6 10*3/uL (ref 4.0–10.5)

## 2014-02-12 LAB — GLUCOSE, CAPILLARY: GLUCOSE-CAPILLARY: 87 mg/dL (ref 70–99)

## 2014-02-12 LAB — COMPREHENSIVE METABOLIC PANEL
ALT: 77 U/L — ABNORMAL HIGH (ref 0–53)
AST: 104 U/L — ABNORMAL HIGH (ref 0–37)
Albumin: 2.1 g/dL — ABNORMAL LOW (ref 3.5–5.2)
Alkaline Phosphatase: 104 U/L (ref 39–117)
BILIRUBIN TOTAL: 1.4 mg/dL — AB (ref 0.3–1.2)
BUN: 13 mg/dL (ref 6–23)
CO2: 24 meq/L (ref 19–32)
CREATININE: 0.84 mg/dL (ref 0.50–1.35)
Calcium: 7.6 mg/dL — ABNORMAL LOW (ref 8.4–10.5)
Chloride: 101 mEq/L (ref 96–112)
Glucose, Bld: 86 mg/dL (ref 70–99)
Potassium: 3.9 mEq/L (ref 3.7–5.3)
Sodium: 135 mEq/L — ABNORMAL LOW (ref 137–147)
Total Protein: 6 g/dL (ref 6.0–8.3)

## 2014-02-12 SURGERY — EGD (ESOPHAGOGASTRODUODENOSCOPY)
Anesthesia: Monitor Anesthesia Care

## 2014-02-12 MED ORDER — FENTANYL CITRATE 0.05 MG/ML IJ SOLN: 50.0000 ug | Freq: Once | INTRAMUSCULAR | Status: DC

## 2014-02-12 MED ORDER — MIDAZOLAM HCL 2 MG/2ML IJ SOLN: 1.0000 mg | INTRAMUSCULAR | Status: DC | PRN

## 2014-02-12 MED ORDER — CALCIUM CARBONATE ANTACID 500 MG PO CHEW
1.0000 | CHEWABLE_TABLET | Freq: Once | ORAL | Status: AC
  Administered 2014-02-12: 400 mg via ORAL
  Filled 2014-02-12: qty 2

## 2014-02-12 MED ORDER — LORAZEPAM 2 MG/ML IJ SOLN
1.0000 mg | Freq: Once | INTRAMUSCULAR | Status: AC
  Administered 2014-02-12: 1 mg via INTRAVENOUS
  Filled 2014-02-12: qty 1

## 2014-02-12 MED ORDER — VANCOMYCIN HCL 125 MG PO CAPS: 125.0000 mg | ORAL_CAPSULE | Freq: Three times a day (TID) | ORAL | Status: AC

## 2014-02-12 MED ORDER — MIDAZOLAM HCL 5 MG/5ML IJ SOLN
INTRAMUSCULAR | Status: DC | PRN
  Administered 2014-02-12: 2 mg via INTRAVENOUS

## 2014-02-12 MED ORDER — SPIRONOLACTONE 100 MG PO TABS: 100.0000 mg | ORAL_TABLET | Freq: Every day | ORAL | Status: DC

## 2014-02-12 MED ORDER — MIDAZOLAM HCL 2 MG/2ML IJ SOLN
1.0000 mg | INTRAMUSCULAR | Status: DC | PRN
Start: 2014-02-12 — End: 2014-02-12

## 2014-02-12 MED ORDER — BUTAMBEN-TETRACAINE-BENZOCAINE 2-2-14 % EX AERO
INHALATION_SPRAY | CUTANEOUS | Status: DC | PRN
  Administered 2014-02-12: 1 via TOPICAL

## 2014-02-12 MED ORDER — SODIUM CHLORIDE 0.9 % IV SOLN
INTRAVENOUS | Status: DC | PRN
  Administered 2014-02-12: 09:00:00 via INTRAVENOUS

## 2014-02-12 MED ORDER — OXYCODONE HCL 5 MG/5ML PO SOLN: 5.0000 mg | Freq: Once | ORAL | Status: DC | PRN

## 2014-02-12 MED ORDER — SODIUM CHLORIDE 0.9 % IV SOLN
INTRAVENOUS | Status: DC
  Administered 2014-02-12: 06:00:00 via INTRAVENOUS

## 2014-02-12 MED ORDER — ONDANSETRON HCL 4 MG/2ML IJ SOLN
INTRAMUSCULAR | Status: DC | PRN
  Administered 2014-02-12: 4 mg via INTRAVENOUS

## 2014-02-12 MED ORDER — GADOBENATE DIMEGLUMINE 529 MG/ML IV SOLN
20.0000 mL | Freq: Once | INTRAVENOUS | Status: AC | PRN
  Administered 2014-02-12: 20 mL via INTRAVENOUS

## 2014-02-12 MED ORDER — PROPOFOL 10 MG/ML IV BOLUS
INTRAVENOUS | Status: DC | PRN
  Administered 2014-02-12: 30 mg via INTRAVENOUS
  Administered 2014-02-12 (×3): 20 mg via INTRAVENOUS

## 2014-02-12 MED ORDER — LACTATED RINGERS IV SOLN
INTRAVENOUS | Status: DC
  Administered 2014-02-12: 1000 mL via INTRAVENOUS

## 2014-02-12 MED ORDER — PROMETHAZINE HCL 25 MG/ML IJ SOLN: 6.2500 mg | INTRAMUSCULAR | Status: DC | PRN

## 2014-02-12 MED ORDER — LIDOCAINE HCL (CARDIAC) 20 MG/ML IV SOLN
INTRAVENOUS | Status: DC | PRN
  Administered 2014-02-12: 60 mg via INTRAVENOUS

## 2014-02-12 MED ORDER — FENTANYL CITRATE 0.05 MG/ML IJ SOLN: 50.0000 ug | INTRAMUSCULAR | Status: DC | PRN

## 2014-02-12 MED ORDER — FENTANYL CITRATE 0.05 MG/ML IJ SOLN
INTRAMUSCULAR | Status: DC | PRN
  Administered 2014-02-12: 100 ug via INTRAVENOUS

## 2014-02-12 MED ORDER — FUROSEMIDE 40 MG PO TABS: 40.0000 mg | ORAL_TABLET | Freq: Every day | ORAL | Status: DC

## 2014-02-12 MED ORDER — OXYCODONE HCL 5 MG PO TABS: 5.0000 mg | ORAL_TABLET | Freq: Once | ORAL | Status: DC | PRN

## 2014-02-12 NOTE — Op Note (Signed)
[facility] [place], [address]   OPERATIVE PROCEDURE REPORT  PATIENT: George Fuller, George Fuller  MR#: [id number] BIRTHDATE: 07-06-1956  GENDER: Male ENDOSCOPIST: Carol Ada, MD ASSISTANT:   Corwin Levins, RN and Cristopher Estimable, technician PROCEDURE DATE: 02/12/2014 PROCEDURE:   EGD w/ band ligation of varices ASA CLASS:   Class III INDICATIONS: Esophageal varices MEDICATIONS: MAC sedation, administered by CRNA TOPICAL ANESTHETIC:   Cetacaine Spray  DESCRIPTION OF PROCEDURE:   After the risks benefits and alternatives of the procedure were thoroughly explained, informed consent was obtained.  The Pentax Gastroscope [id number]  endoscope was introduced through the mouth  and advanced to the second portion of the duodenum Without limitations.      The instrument was slowly withdrawn as the mucosa was fully examined.     FINDINGS: Medium-sized, nonbleeding esophageal varices were identified.  The prior scarring/ulcerations were noted from the recent banding.  Repeat banding was performed.  Seven bands were successfully deployed, but it was more difficult as the varices were smaller.  No evidence of gastric fundic varices.  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  The duodenum was normal. Retroflexed views revealed no abnormalities.     The scope was then withdrawn from the patient and the procedure terminated.  COMPLICATIONS: There were no complications.  IMPRESSION: 1) Esophageal varices s/p banding. 2) Portal HTN gastropathy.  RECOMMENDATIONS: 1) Await MRI results. 2) Follow up in the office in one month. 3) Continue with Step I diuretics, i.e., Lasix 40 mg QD and Spironolactone 100 mg QD, and a low salt diet. 4) Signing off.  _______________________________ Lorrin MaisCarol Ada, MD 02/12/2014 9:18 AM

## 2014-02-12 NOTE — Anesthesia Preprocedure Evaluation (Signed)
Anesthesia Evaluation  Patient identified by MRN, date of birth, ID band Patient awake    Reviewed: Allergy & Precautions, H&P , NPO status , Patient's Chart, lab work & pertinent test results  Airway Mallampati: II TM Distance: >3 FB Neck ROM: Full    Dental   Pulmonary COPDCurrent Smoker,  + rhonchi         Cardiovascular Rhythm:Regular Rate:Tachycardia     Neuro/Psych    GI/Hepatic (+) Cirrhosis -  Esophageal Varices    , Hepatitis -, CLiver mass esoph varices   Endo/Other    Renal/GU      Musculoskeletal   Abdominal (+) + obese,   Peds  Hematology  (+) anemia ,   Anesthesia Other Findings Low plt count  Reproductive/Obstetrics                           Anesthesia Physical Anesthesia Plan  ASA: IV  Anesthesia Plan: MAC   Post-op Pain Management:    Induction: Intravenous  Airway Management Planned: Nasal Cannula  Additional Equipment:   Intra-op Plan:   Post-operative Plan:   Informed Consent: I have reviewed the patients History and Physical, chart, labs and discussed the procedure including the risks, benefits and alternatives for the proposed anesthesia with the patient or authorized representative who has indicated his/her understanding and acceptance.     Plan Discussed with: CRNA and Surgeon  Anesthesia Plan Comments:         Anesthesia Quick Evaluation

## 2014-02-12 NOTE — Progress Notes (Signed)
Subjective: He is s/p 4/30 paracentesis, removed 5.3L-culture NGTD.  No SBP.  VSS.  He reports feeling well today without complaints.    Objective: Vital signs in last 24 hours: Filed Vitals:   02/12/14 0935 02/12/14 1026 02/12/14 1124 02/12/14 1350  BP: 148/86  128/75 117/79  Pulse: 84  89 79  Temp:   98 F (36.7 C) 97.9 F (36.6 C)  TempSrc:   Oral Oral  Resp: 21  20 18   Height:      Weight:      SpO2: 97% 98% 98% 97%   Weight change: 14.4 oz (0.408 kg) Weight 236 on admission-->228 lbs yesterday  Intake/Output Summary (Last 24 hours) at 02/12/14 1405 Last data filed at 02/12/14 1351  Gross per 24 hour  Intake    360 ml  Output    900 ml  Net   -540 ml   Physical Exam General: alert, cooperative, NAD HEENT: NCAT, vision grossly intact Neck: supple Lungs: CTAB, normal WOB Heart: RRR, 3/6 systolic murmur Abdomen: abdomen protuberant but much less distended today, NT, normal bowel sounds Extremities: 1-2+ pitting edema of BLE feet to knees Neurologic: A&O X3, cranial nerves II-XII grossly intact  Lab Results: Basic Metabolic Panel:  Recent Labs Lab 02/09/14  02/11/14 0149 02/12/14 0732  NA  --   < > 137 135*  K  --   < > 4.1 3.9  CL  --   < > 103 101  CO2  --   < > 25 24  GLUCOSE  --   < > 105* 86  BUN  --   < > 15 13  CREATININE  --   < > 0.87 0.84  CALCIUM  --   < > 7.5* 7.6*  MG 1.9  --   --   --   PHOS 3.3  --   --   --   < > = values in this interval not displayed. Liver Function Tests:  Recent Labs Lab 02/11/14 0149 02/12/14 0732  AST 101* 104*  ALT 80* 77*  ALKPHOS 111 104  BILITOT 0.9 1.4*  PROT 6.0 6.0  ALBUMIN 2.0* 2.1*   CBC:  Recent Labs Lab 02/11/14 0149 02/12/14 0732  WBC 7.4 6.0  HGB 9.3* 9.6*  HCT 28.6* 29.8*  MCV 94.1 94.9  PLT 92* 87*   Cardiac Enzymes:  Recent Labs Lab 02/09/14 1749 02/10/14 2023 02/11/14 0149  TROPONINI <0.30 <0.30 <0.30   BNP:  Recent Labs Lab 02/09/14 1749  PROBNP 42.9    Coagulation:  Recent Labs Lab 02/09/14 1749  LABPROT 16.7*  INR 1.39   Studies/Results: Mr Abdomen W Wo Contrast  02/12/2014   CLINICAL DATA:  Cirrhosis. Ascites. Variceal bleed. Possible liver lesion noted on ultrasound examination. Evaluate for potential hepatocellular carcinoma.  EXAM: MRI ABDOMEN WITHOUT AND WITH CONTRAST  TECHNIQUE: Multiplanar multisequence MR imaging of the abdomen was performed both before and after the administration of intravenous contrast.  CONTRAST:  73mL MULTIHANCE GADOBENATE DIMEGLUMINE 529 MG/ML IV SOLN  COMPARISON:  CT of the abdomen and pelvis 01/28/2014.  FINDINGS: The liver has a markedly shrunken appearance and nodular contour, compatible with advanced cirrhosis. In the subcapsular aspect of the right lobe of the liver (predominantly within segment 7) there is a well-defined lesion that is not well visualized on T2 weighted images, demonstrates T1 hyperintensity on precontrast images, and is nearly uniformly hyperintense on arterial phase post gadolinium images with subsequent washout on delayed phase imaging, compatible  with a hepatocellular carcinoma. This is best demonstrated on image 27 of series 1301, where the lesion measures 2.5 cm in diameter. No other suspicious hepatic lesions are noted. Portal vein is dilated measuring up to 16 mm in short axis. The spleen is enlarged measuring 15.4 x 9.6 x 15.1 cm. Moderate volume of ascites.  The appearance of the gallbladder, pancreas, bilateral adrenal glands and the left kidney is unremarkable. Sub cm lesion in the right kidney is low signal intensity on T1 weighted images, high signal intensity on T2 weighted images, and does not enhance, compatible with a small simple cyst.  IMPRESSION: 1. Advanced cirrhosis with a 2.5 cm hypervascular lesion located in the segment 7 of the liver which has imaging characteristics diagnostic of a hepatocellular carcinoma. Surgical consultation is recommended. 2. Dilatation of the  portal vein indicative of portal hypertension. 3. Splenomegaly.   Electronically Signed   By: Vinnie Langton M.D.   On: 02/12/2014 13:52   US Abdomen Complete  02/10/2014   CLINICAL DATA:  Abdominal distention.  Elevated LFTs.  EXAM: ULTRASOUND ABDOMEN COMPLETE  COMPARISON:  CT 01/28/2014  FINDINGS: Gallbladder:  Sludge noted within the gallbladder. No stones. Gallbladder wall is markedly thickened at 10 mm. Negative sonographic Murphy's.  Common bile duct:  Diameter: Normal caliber, 4 mm.  Liver:  Markedly cirrhotic liver which is shrunken and nodular. Probable focal isoechoic nodule in the dome a measures 2.9 x 2.6 x 2.5 cm. This is not definitively visualized on recent CT.  IVC:  No abnormality visualized.  Pancreas:  Not well visualized due to overlying bowel gas.  Spleen:  Splenomegaly with a craniocaudal length of 14.8 cm and a volume of 665 mL. No focal abnormality.  Right Kidney:  Length: 10.0 cm. Echogenicity within normal limits. No mass or hydronephrosis visualized.  Left Kidney:  Length: 11.3 cm. Echogenicity within normal limits. No mass or hydronephrosis visualized.  Abdominal aorta:  No aneurysm visualized.  Other findings:  Ascites noted throughout the abdomen.  IMPRESSION: Changes of severe cirrhosis as seen on prior CT. There appears to be a solid isoechoic 2.9 cm nodule in the dome of the liver. This could be further evaluated with liver protocol MRI.  Sludge within the gallbladder. Marked gallbladder wall thickening. I favor this is most likely related to liver disease although chronic cholecystitis cannot be excluded. No stones.  Moderate upper abdominal ascites.   Electronically Signed   By: Rolm Baptise M.D.   On: 02/10/2014 16:47   [place] Art/ven Flow Abd Pelv Doppler  02/10/2014   CLINICAL DATA:  Hepatitis-C, cirrhosis and ascites.  EXAM: DUPLEX ULTRASOUND OF LIVER  TECHNIQUE: Color and duplex Doppler ultrasound was performed to evaluate the hepatic in-flow and out-flow vessels.   COMPARISON:  None.  FINDINGS: Portal Vein Velocities  Main:  26 cm/sec  Right:  29 cm/sec  Left:  22 cm/sec  Hepatic Vein Velocities  Right:  90 cm/sec  Middle:  45 cm/sec  Left:  54 cm/sec  Hepatic Artery Velocity:  72 cm/sec  Splenic Vein Velocity:  35 cm/sec  Varices: None visualized.  Ascites: Moderate ascites present.  There is no evidence of portal vein thrombosis or significant reduction of portal vein velocities. Portal waveforms are normal without pulsatility and flow direction in the portal vein is towards the liver. There is no evidence of hepatic veno-occlusive disease or splenic vein thrombosis. The spleen is at least mildly enlarged with estimated volume of 666 mL by ultrasound.  IMPRESSION: No evidence  of portal vein thrombosis or significant reduction in portal vein velocities.   Electronically Signed   By: Aletta Edouard M.D.   On: 02/10/2014 16:35   US Paracentesis  02/11/2014   CLINICAL DATA:  Hepatitis C, ascites, request for paracentesis.  EXAM: ULTRASOUND GUIDED diagnostic and therapeutic PARACENTESIS  COMPARISON:  None.  PROCEDURE: An ultrasound guided paracentesis was thoroughly discussed with the patient and questions answered. The benefits, risks, alternatives and complications were also discussed. The patient understands and wishes to proceed with the procedure. Written consent was obtained.  Ultrasound was performed to localize and mark an adequate pocket of fluid in the right upper quadrant of the abdomen. The area was then prepped and draped in the normal sterile fashion. 1% Lidocaine was used for local anesthesia. Under ultrasound guidance a 19 gauge Yueh catheter was introduced. Paracentesis was performed. The catheter was removed and a dressing applied.  Complications: None.  FINDINGS: A total of approximately 5.3 liters of clear yellow fluid was removed. A fluid sample was sent for laboratory analysis.  IMPRESSION: Successful ultrasound guided paracentesis yielding 5.3 liters of  ascites.  Read By:  Tsosie Billing PA-C   Electronically Signed   By: Maryclare Bean M.D.   On: 02/11/2014 09:23   Medications: I have reviewed the patient's current medications. Scheduled Meds: . furosemide  40 mg Oral Daily  . nicotine  21 mg Transdermal Q24H  . spironolactone  100 mg Oral Daily  . vancomycin  125 mg Oral 4 times per day   Continuous Infusions:   PRN Meds:.  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 Wt down since admission.  VSS.  AFP elevated at 12.3; s/p repeat banding today.  MRI abdomen shows advanced cirrhosis with a 2.5cm hypervascular lesion diagnostic of [place] and recommend surgical consult.  Portal HTN and splenomegaly present. -surgery consult given MRI results   -GI consult, appreciate recs  -continue lasix 40mg  daily and spironolactone 100mg  daily  -continue leg and scrotal elevation -strict I/Os, daily weights -fluid, sodium restricted diet   #New Q waves on EKG:  Pt with persistent q waves in leads II, III, aVF which are new since his last hospitalization.  Trops neg.  Dr. Johnsie Cancel saw patient this morning and did not think this was silent MI and plans to obtain Echo given patient's systolic murmur. Lipid panel- Total cholesterol 64, TG 50, HDL 23, LDL 31.  -Echo today  #Hepatitis C: New diagnosis last admission, HCV genotype 3. Hep B negative, HIV nonreactive last admission. Patient in process of applying for Medicaid to cover treatment. Dr. Benson Norway following. -per above; likely [facility]  #History of C. Diff colitis: Diarrhea improving. No abd pain. Continuing vancomycin PO therapy (last day 5/1).  -continue vancomycin PO 125 QID through 5/1  #Diet: -Fluid (1.2L), sodium restricted (2g) diet   #DVT PPX:  SCDs for now given recent GI bleed  #Code status: Full code   Dispo: Discharge plans for today.    The patient does have a current PCP Chari Manning, NP) and does not need an  [facility] hospital follow-up appointment after discharge.     LOS: 3 days   Jones Bales, MD 02/12/2014, 2:05 PM

## 2014-02-12 NOTE — Transfer of Care (Signed)
Immediate Anesthesia Transfer of Care Note  Patient: George Fuller  Procedure(s) Performed: Procedure(s): ESOPHAGOGASTRODUODENOSCOPY (EGD) (N/A) ESOPHAGEAL BANDING (N/A)  Patient Location: PACU and Endoscopy Unit  Anesthesia Type:MAC  Level of Consciousness: awake, alert , oriented and patient cooperative  Airway & Oxygen Therapy: Patient Spontanous Breathing and Patient connected to nasal cannula oxygen  Post-op Assessment: Report given to PACU RN, Post -op Vital signs reviewed and stable and Patient moving all extremities  Post vital signs: Reviewed and stable  Complications: No apparent anesthesia complications

## 2014-02-12 NOTE — Progress Notes (Signed)
  Echocardiogram 2D Echocardiogram has been performed.  [place] 02/12/2014, 4:25 PM

## 2014-02-12 NOTE — Interval H&P Note (Signed)
History and Physical Interval Note:  02/12/2014 8:39 AM  George Fuller  has presented today for surgery, with the diagnosis of Esophageal varices  The various methods of treatment have been discussed with the patient and family. After consideration of risks, benefits and other options for treatment, the patient has consented to  Procedure(s): ESOPHAGOGASTRODUODENOSCOPY (EGD) (N/A) ESOPHAGEAL BANDING (N/A) as a surgical intervention .  The patient's history has been reviewed, patient examined, no change in status, stable for surgery.  I have reviewed the patient's chart and labs.  Questions were answered to the patient's satisfaction.     Beryle Beams

## 2014-02-12 NOTE — Progress Notes (Signed)
Patient had been complaining of pain, cramps, and shaking in hands bilaterally.  Stated that pain and cramps had occurred since yesterday during day shift.  Notified MD on call; because patient was currently asleep, MD did not wish to disturb patient; no new orders received.  MD stated if patient were to complain of pain and cramps in hands again, then to RN should notify. Will continue to monitor.

## 2014-02-12 NOTE — Discharge Instructions (Signed)
°  Please continue present plan and take all medications as prescribed.    Please take 1 tablet of vancomycin every 6 hrs for today (02/12/2014) and then stop.    Please be sure to bring all of your medications with you to every visit; this includes herbal supplements, vitamins, eye drops, and any over-the-counter medications.   Should you have any questions regarding your medications and/or any new or worsening symptoms, please be sure to call the [facility] [phone].  If you believe that you are suffering from a life threatening condition or one that may result in the loss of limb or function, then you should call 911 or proceed to the nearest Emergency Department.

## 2014-02-12 NOTE — Progress Notes (Deleted)
[place] letter provided to patient

## 2014-02-12 NOTE — Anesthesia Postprocedure Evaluation (Signed)
  Anesthesia Post-op Note  Patient: George Fuller  Procedure(s) Performed: Procedure(s): ESOPHAGOGASTRODUODENOSCOPY (EGD) (N/A) ESOPHAGEAL BANDING (N/A)  Patient Location: Endoscopy Unit  Anesthesia Type:MAC  Level of Consciousness: awake  Airway and Oxygen Therapy: Patient Spontanous Breathing  Post-op Pain: mild  Post-op Assessment: Post-op Vital signs reviewed, Patient's Cardiovascular Status Stable, Respiratory Function Stable, Patent Airway and No signs of Nausea or vomiting  Post-op Vital Signs: Reviewed and stable  Last Vitals:  Filed Vitals:   02/12/14 0918  BP: 152/94  Pulse: 83  Temp: 37 C  Resp: 16    Complications: No apparent anesthesia complications

## 2014-02-12 NOTE — H&P (View-Only) (Signed)
Subjective: Feeling better after the paracentesis.  Objective: Vital signs in last 24 hours: Temp:  [98.1 F (36.7 C)-99 F (37.2 C)] 99 F (37.2 C) (04/30 0450) Pulse Rate:  [87-99] 88 (04/30 0450) Resp:  [18-20] 19 (04/30 0450) BP: (100-124)/(53-72) 100/58 mmHg (04/30 0450) SpO2:  [95 %-97 %] 95 % (04/30 0450) Weight:  [227 lb 6.4 oz (103.148 kg)] 227 lb 6.4 oz (103.148 kg) (04/29 2003) Last BM Date: 02/10/14  Intake/Output from previous day: 04/29 0701 - 04/30 0700 In: 720 [P.O.:720] Out: 1130 [Urine:1130] Intake/Output this shift:    General appearance: alert and no distress GI: distended abdomen  Lab Results:  Recent Labs  02/09/14 1747 02/10/14 02/11/14 0149  WBC 8.1 6.8 7.4  HGB 10.7* 9.5* 9.3*  HCT 32.0* 28.7* 28.6*  PLT 105* 98* 92*   BMET  Recent Labs  02/09/14 1749 02/10/14 02/11/14 0149  NA 136* 136* 137  K 4.1 3.9 4.1  CL 104 104 103  CO2 21 25 25   GLUCOSE 83 116* 105*  BUN 13 15 15   CREATININE 0.85 0.84 0.87  CALCIUM 8.0* 7.8* 7.5*   LFT  Recent Labs  02/11/14 0149  PROT 6.0  ALBUMIN 2.0*  AST 101*  ALT 80*  ALKPHOS 111  BILITOT 0.9   PT/INR  Recent Labs  02/09/14 1749  LABPROT 16.7*  INR 1.39   Hepatitis Panel No results found for this basename: HEPBSAG, HCVAB, HEPAIGM, HEPBIGM,  in the last 72 hours C-Diff No results found for this basename: CDIFFTOX,  in the last 72 hours Fecal Lactopherrin No results found for this basename: FECLLACTOFRN,  in the last 72 hours  Studies/Results: Dg Chest 2 View  02/09/2014   CLINICAL DATA:  Bilateral leg swelling  EXAM: CHEST  2 VIEW  COMPARISON:  DG CHEST 1V PORT dated 01/28/2014  FINDINGS: The lungs are hypoinflated. There is no focal infiltrate. The interstitial markings are mildly prominent bilaterally. The cardiopericardial silhouette is top-normal in size. The pulmonary vascularity is not engorged. The mediastinum is normal in width. No pleural effusion is demonstrated. There is no  pneumothorax.  Within the upper abdomen there are loops of minimally distended gas-filled small bowel in the epigastrium and air and fluid within the hepatic flexure of the colon.  IMPRESSION: There is bilateral pulmonary hypoinflation. There is no definite evidence of active cardiopulmonary disease. Within the upper abdomen the gas pattern is nonspecific but may reflect an ileus or gastroenteritis.   Electronically Signed   By: Acie  Martinique   On: 02/09/2014 19:22   US Abdomen Complete  02/10/2014   CLINICAL DATA:  Abdominal distention.  Elevated LFTs.  EXAM: ULTRASOUND ABDOMEN COMPLETE  COMPARISON:  CT 01/28/2014  FINDINGS: Gallbladder:  Sludge noted within the gallbladder. No stones. Gallbladder wall is markedly thickened at 10 mm. Negative sonographic Murphy's.  Common bile duct:  Diameter: Normal caliber, 4 mm.  Liver:  Markedly cirrhotic liver which is shrunken and nodular. Probable focal isoechoic nodule in the dome a measures 2.9 x 2.6 x 2.5 cm. This is not definitively visualized on recent CT.  IVC:  No abnormality visualized.  Pancreas:  Not well visualized due to overlying bowel gas.  Spleen:  Splenomegaly with a craniocaudal length of 14.8 cm and a volume of 665 mL. No focal abnormality.  Right Kidney:  Length: 10.0 cm. Echogenicity within normal limits. No mass or hydronephrosis visualized.  Left Kidney:  Length: 11.3 cm. Echogenicity within normal limits. No mass or hydronephrosis visualized.  Abdominal  aorta:  No aneurysm visualized.  Other findings:  Ascites noted throughout the abdomen.  IMPRESSION: Changes of severe cirrhosis as seen on prior CT. There appears to be a solid isoechoic 2.9 cm nodule in the dome of the liver. This could be further evaluated with liver protocol MRI.  Sludge within the gallbladder. Marked gallbladder wall thickening. I favor this is most likely related to liver disease although chronic cholecystitis cannot be excluded. No stones.  Moderate upper abdominal ascites.    Electronically Signed   By: Rolm Baptise M.D.   On: 02/10/2014 16:47   [place] Art/ven Flow Abd Pelv Doppler  02/10/2014   CLINICAL DATA:  Hepatitis-C, cirrhosis and ascites.  EXAM: DUPLEX ULTRASOUND OF LIVER  TECHNIQUE: Color and duplex Doppler ultrasound was performed to evaluate the hepatic in-flow and out-flow vessels.  COMPARISON:  None.  FINDINGS: Portal Vein Velocities  Main:  26 cm/sec  Right:  29 cm/sec  Left:  22 cm/sec  Hepatic Vein Velocities  Right:  90 cm/sec  Middle:  45 cm/sec  Left:  54 cm/sec  Hepatic Artery Velocity:  72 cm/sec  Splenic Vein Velocity:  35 cm/sec  Varices: None visualized.  Ascites: Moderate ascites present.  There is no evidence of portal vein thrombosis or significant reduction of portal vein velocities. Portal waveforms are normal without pulsatility and flow direction in the portal vein is towards the liver. There is no evidence of hepatic veno-occlusive disease or splenic vein thrombosis. The spleen is at least mildly enlarged with estimated volume of 666 mL by ultrasound.  IMPRESSION: No evidence of portal vein thrombosis or significant reduction in portal vein velocities.   Electronically Signed   By: Aletta Edouard M.D.   On: 02/10/2014 16:35    Medications:  Scheduled: . furosemide  40 mg Oral Daily  . nicotine  21 mg Transdermal Q24H  . spironolactone  100 mg Oral Daily  . vancomycin  125 mg Oral 4 times per day   Continuous:   Assessment/Plan: 1) HCV cirrhosis. 2) History of esophageal varices. 3) ? Hepatoma.  4) Ascites.   He has ascites, but variceal bleed.  Currently he is on Step I diuretics and a low salt diet.  Ultrasound reveals the possibility of a hepatoma, however, it was not identified on the CT scan.  A mild elevation of the AFP was noted.  Plan: 1) Repeat EGD with banding tomorrow. 2) Continue with diuretics. 3) MRI of the liver.  LOS: 2 days   Beryle Beams 02/11/2014, 7:33 AM

## 2014-02-12 NOTE — Progress Notes (Signed)
MATCH in front of shadow chart, advised patient's nurse

## 2014-02-15 ENCOUNTER — Encounter (HOSPITAL_COMMUNITY): Payer: Self-pay | Admitting: Gastroenterology

## 2014-02-15 LAB — BODY FLUID CULTURE
Culture: NO GROWTH
GRAM STAIN: NONE SEEN

## 2014-02-23 ENCOUNTER — Encounter: Payer: Self-pay | Admitting: Internal Medicine

## 2014-02-23 ENCOUNTER — Ambulatory Visit: Payer: Medicaid Other | Attending: Internal Medicine | Admitting: Internal Medicine

## 2014-02-23 VITALS — BP 117/74 | HR 97 | Temp 98.5°F | Resp 16 | Ht 71.0 in | Wt 201.4 lb

## 2014-02-23 DIAGNOSIS — C228 Malignant neoplasm of liver, primary, unspecified as to type: Secondary | ICD-10-CM | POA: Diagnosis not present

## 2014-02-23 DIAGNOSIS — Z79899 Other long term (current) drug therapy: Secondary | ICD-10-CM

## 2014-02-23 DIAGNOSIS — F17200 Nicotine dependence, unspecified, uncomplicated: Secondary | ICD-10-CM | POA: Diagnosis not present

## 2014-02-23 DIAGNOSIS — K7460 Unspecified cirrhosis of liver: Secondary | ICD-10-CM | POA: Insufficient documentation

## 2014-02-23 DIAGNOSIS — G4700 Insomnia, unspecified: Secondary | ICD-10-CM

## 2014-02-23 DIAGNOSIS — C220 Liver cell carcinoma: Secondary | ICD-10-CM

## 2014-02-23 DIAGNOSIS — R188 Other ascites: Secondary | ICD-10-CM | POA: Diagnosis not present

## 2014-02-23 DIAGNOSIS — B1920 Unspecified viral hepatitis C without hepatic coma: Secondary | ICD-10-CM | POA: Insufficient documentation

## 2014-02-23 LAB — COMPLETE METABOLIC PANEL WITH GFR
ALT: 68 U/L — AB (ref 0–53)
AST: 102 U/L — ABNORMAL HIGH (ref 0–37)
Albumin: 2.7 g/dL — ABNORMAL LOW (ref 3.5–5.2)
Alkaline Phosphatase: 106 U/L (ref 39–117)
BILIRUBIN TOTAL: 1.6 mg/dL — AB (ref 0.2–1.2)
BUN: 14 mg/dL (ref 6–23)
CO2: 26 meq/L (ref 19–32)
CREATININE: 0.98 mg/dL (ref 0.50–1.35)
Calcium: 8.6 mg/dL (ref 8.4–10.5)
Chloride: 101 mEq/L (ref 96–112)
GFR, Est Non African American: 84 mL/min
Glucose, Bld: 87 mg/dL (ref 70–99)
Potassium: 4.4 mEq/L (ref 3.5–5.3)
Sodium: 134 mEq/L — ABNORMAL LOW (ref 135–145)
Total Protein: 7.5 g/dL (ref 6.0–8.3)

## 2014-02-23 NOTE — Progress Notes (Signed)
Patient is here for F/U after Hospital admission for ascites. States he is doing much better. States he has recently been diagnosed with liver cancer and being followed by DR at [facility]. Patient states 2 week history of insomnia. Would like to talk about nicotine patch to help quit smoking

## 2014-02-24 NOTE — Progress Notes (Signed)
Patient ID: George Fuller, male   DOB: 1956/05/28, 58 y.o.   MRN: [id number]  CC: Hospital F/u for ascites   HPI:  Patient and wife present today for a hospital follow-up. Patient was seen in our office 2 weeks ago and was sent to the ER for ascites and severe edema.  Patient reports that he had over 5 liters removed by paracentesis while hospitalized.  His wife reports that he was diagnosed with Stage I liver cancer.  He is currently schedule to enter a clinical trial at [facility] which he will receive treatment for his cancer and Hepatitis C.  The patient reports that he will also be on the waiting list for a transplant.  The patient reports interest in smoking cessation as well today.  No Known Allergies Past Medical History  Diagnosis Date  . Varices, esophageal   . Clostridium difficile infection   . Liver cirrhosis   . Hepatitis C   . HCC (hepatocellular carcinoma) 02/12/2014  . Ascites    Current Outpatient Prescriptions on File Prior to Visit  Medication Sig Dispense Refill  . furosemide (LASIX) 40 MG tablet Take 1 tablet (40 mg total) by mouth daily.  30 tablet  3  . spironolactone (ALDACTONE) 100 MG tablet Take 1 tablet (100 mg total) by mouth daily.  30 tablet  3  . oxyCODONE (OXY IR/ROXICODONE) 5 MG immediate release tablet Take 1 tablet (5 mg total) by mouth every 4 (four) hours as needed for severe pain.  30 tablet  0   No current facility-administered medications on file prior to visit.   Family History  Problem Relation Age of Onset  . COPD Mother   . Alcohol abuse Father    History   Social History  . Marital Status: Married    Spouse Name: N/A    Number of Children: N/A  . Years of Education: N/A   Occupational History  . Not on file.   Social History Main Topics  . Smoking status: Smoker, Current Status Unknown -- 1.00 packs/day for 30 years    Types: Cigarettes  . Smokeless tobacco: Not on file  . Alcohol Use: No  . Drug Use: No  . Sexual Activity: Not  on file   Other Topics Concern  . Not on file   Social History Narrative  . No narrative on file    Review of Systems: Constitutional: Negative for fever, chills, diaphoresis, activity change, appetite change and fatigue. HENT: Negative for ear pain, nosebleeds, congestion, facial swelling, rhinorrhea, neck pain, neck stiffness and ear discharge.  Eyes: Negative for pain, discharge, redness, itching and visual disturbance. Respiratory: Negative for cough, choking, chest tightness, shortness of breath, wheezing and stridor.  Cardiovascular: Negative for chest pain, palpitations. Positive for leg swelling. Gastrointestinal: Positive for abdominal distension and tenderness. Genitourinary: Negative for dysuria, urgency, frequency, hematuria, flank pain, decreased urine volume, difficulty urinating and dyspareunia. Positive for scrotal swelling. Musculoskeletal: Negative for back pain, joint swelling, arthralgias and gait problem. Neurological: Negative for dizziness, tremors, seizures, syncope, facial asymmetry, speech difficulty, weakness, light-headedness, numbness and headaches.  Hematological: Negative for adenopathy. Does not bruise/bleed easily. Psychiatric/Behavioral: Negative for hallucinations, behavioral problems, confusion, dysphoric mood, decreased concentration and agitation. Positive for depression   Objective:   Filed Vitals:   02/23/14 1505  BP: 117/74  Pulse: 97  Temp: 98.5 F (36.9 C)  Resp: 16    Physical Exam: Constitutional: Patient appears well-developed and well-nourished. No distress. HENT: Normocephalic, atraumatic, External right and left  ear normal. Oropharynx is clear and moist.  Eyes: Conjunctivae and EOM are normal. PERRLA, no scleral icterus. Neck: Normal ROM. Neck supple. No JVD. No tracheal deviation. No thyromegaly. CVS: RRR, S1/S2 +, no murmurs, no gallops, no carotid bruit.  Pulmonary: Effort and breath sounds normal, no stridor, rhonchi,  wheezes, rales.  Abdominal: Soft. BS +,  Abdominal distension and tenderness remaining.  Musculoskeletal: Normal range of motion. 2+ bilateral pedal edema, 2+ pedal pulses Lymphadenopathy: No lymphadenopathy noted, cervical,  Neuro: Alert. Skin: Skin is warm and dry. No rash noted. Not diaphoretic. No erythema. No pallor. Psychiatric: Normal mood and affect. Behavior, judgment, thought content normal.  Lab Results  Component Value Date   WBC 6.0 02/12/2014   HGB 9.6* 02/12/2014   HCT 29.8* 02/12/2014   MCV 94.9 02/12/2014   PLT 87* 02/12/2014   Lab Results  Component Value Date   CREATININE 0.98 02/23/2014   BUN 14 02/23/2014   NA 134* 02/23/2014   K 4.4 02/23/2014   CL 101 02/23/2014   CO2 26 02/23/2014    No results found for this basename: HGBA1C   Lipid Panel     Component Value Date/Time   CHOL 64 02/10/2014 2023   TRIG 50 02/10/2014 2023   HDL 23* 02/10/2014 2023   CHOLHDL 2.8 02/10/2014 2023   VLDL 10 02/10/2014 2023   LDLCALC 31 02/10/2014 2023       Assessment and plan:   George Fuller was seen today for follow-up and ascites.  Diagnoses and associated orders for this visit:  Ascites Continue Lasix and Aldactone Hepatic carcinoma Continue to f/u with [facility] for treatment of Hep C and cancer Insomnia Patient reports that he has been trying the Melatonin suggested from last visit and has had significant improvement.  He reports that he is now able to get 5 hours of sleep versus 1.  Tobacco use disorder  Hx of long-term treatment with high-risk medication - COMPLETE METABOLIC PANEL WITH GFR Will check potassium level to decide if patient needs replacement  Follow up in 2 weeks to reevaluate edema and ascites.   Chari Manning, NP-C [facility] and [facility] [phone] 02/24/2014, 10:16 PM

## 2014-02-25 ENCOUNTER — Telehealth: Payer: Self-pay

## 2014-02-25 NOTE — Telephone Encounter (Signed)
Patient returned call and is aware of his lab results 

## 2014-02-25 NOTE — Telephone Encounter (Signed)
Message copied by Dorothe Pea on Thu Feb 25, 2014  4:24 PM ------      Message from: Chari Manning A      Created: Tue Feb 23, 2014 11:55 PM       Let patient know his potassium level is normal and he will not need potassium supplements. Thanks ------

## 2014-02-25 NOTE — Telephone Encounter (Signed)
Left message to return our call.

## 2014-03-09 ENCOUNTER — Ambulatory Visit: Payer: Self-pay | Admitting: Internal Medicine

## 2014-04-12 ENCOUNTER — Ambulatory Visit: Payer: Medicaid Other | Attending: Internal Medicine | Admitting: Internal Medicine

## 2014-04-12 ENCOUNTER — Encounter: Payer: Self-pay | Admitting: Internal Medicine

## 2014-04-12 VITALS — BP 94/53 | HR 73 | Temp 98.2°F | Resp 20 | Ht 71.0 in | Wt 175.2 lb

## 2014-04-12 DIAGNOSIS — Z7682 Awaiting organ transplant status: Secondary | ICD-10-CM

## 2014-04-12 DIAGNOSIS — F17200 Nicotine dependence, unspecified, uncomplicated: Secondary | ICD-10-CM | POA: Insufficient documentation

## 2014-04-12 DIAGNOSIS — F32A Depression, unspecified: Secondary | ICD-10-CM

## 2014-04-12 DIAGNOSIS — R188 Other ascites: Secondary | ICD-10-CM | POA: Diagnosis present

## 2014-04-12 DIAGNOSIS — C228 Malignant neoplasm of liver, primary, unspecified as to type: Secondary | ICD-10-CM | POA: Insufficient documentation

## 2014-04-12 DIAGNOSIS — K649 Unspecified hemorrhoids: Secondary | ICD-10-CM | POA: Diagnosis not present

## 2014-04-12 DIAGNOSIS — Z23 Encounter for immunization: Secondary | ICD-10-CM

## 2014-04-12 DIAGNOSIS — B1920 Unspecified viral hepatitis C without hepatic coma: Secondary | ICD-10-CM | POA: Insufficient documentation

## 2014-04-12 DIAGNOSIS — F3289 Other specified depressive episodes: Secondary | ICD-10-CM | POA: Diagnosis not present

## 2014-04-12 DIAGNOSIS — F329 Major depressive disorder, single episode, unspecified: Secondary | ICD-10-CM

## 2014-04-12 DIAGNOSIS — G4700 Insomnia, unspecified: Secondary | ICD-10-CM | POA: Insufficient documentation

## 2014-04-12 DIAGNOSIS — C220 Liver cell carcinoma: Secondary | ICD-10-CM

## 2014-04-12 MED ORDER — HYDROCORTISONE 2.5 % RE CREA: 1.0000 "application " | TOPICAL_CREAM | Freq: Two times a day (BID) | RECTAL | Status: AC

## 2014-04-12 MED ORDER — SPIRONOLACTONE 50 MG PO TABS: 50.0000 mg | ORAL_TABLET | Freq: Every day | ORAL | Status: DC

## 2014-04-12 MED ORDER — TRAZODONE 25 MG HALF TABLET: 25.0000 mg | ORAL_TABLET | Freq: Every day | ORAL | Status: DC

## 2014-04-12 NOTE — Progress Notes (Signed)
Patient ID: George Fuller, male   DOB: Jan 16, 1956, 58 y.o.   MRN: [id number]  CC: Follow up  HPI:  Patient presents today as a follow up for ascites.  Patient reports that George Fuller was seen by his oncologist in [facility] for workup for a liver transplant and was placed on Inderal 20 mg for prevention of esophageal varices.  George Fuller c/o occasional dizziness and warmth since beginning inderal.  Patient also c/o of hemorrhoids and pain for past 2 weeks. [place] last year and bent right wrist back.  Has never had xrays.  Has some swelling and achy pain since fall.   Patient very tearful during exam and reports that George Fuller feels like giving up some days and has frequent dreams of losing control.  Patient denies suicidal ideations at this time. George Fuller states that George Fuller quit smoking yesterday.    No Known Allergies Past Medical History  Diagnosis Date  . Varices, esophageal   . Clostridium difficile infection   . Liver cirrhosis   . Hepatitis C   . HCC (hepatocellular carcinoma) 02/12/2014  . Ascites    Current Outpatient Prescriptions on File Prior to Visit  Medication Sig Dispense Refill  . furosemide (LASIX) 40 MG tablet Take 1 tablet (40 mg total) by mouth daily.  30 tablet  3  . oxyCODONE (OXY IR/ROXICODONE) 5 MG immediate release tablet Take 1 tablet (5 mg total) by mouth every 4 (four) hours as needed for severe pain.  30 tablet  0  . spironolactone (ALDACTONE) 100 MG tablet Take 1 tablet (100 mg total) by mouth daily.  30 tablet  3   No current facility-administered medications on file prior to visit.   Family History  Problem Relation Age of Onset  . COPD Mother   . Alcohol abuse Father    History   Social History  . Marital Status: Married    Spouse Name: N/A    Number of Children: N/A  . Years of Education: N/A   Occupational History  . Not on file.   Social History Main Topics  . Smoking status: Smoker, Current Status Unknown -- 1.00 packs/day for 30 years    Types: Cigarettes  . Smokeless  tobacco: Not on file     Comment: Patient states last cig 04/11/2014  . Alcohol Use: No  . Drug Use: No  . Sexual Activity: Not on file   Other Topics Concern  . Not on file   Social History Narrative  . No narrative on file   Review of Systems  Respiratory: Negative.   Cardiovascular: Negative.   Gastrointestinal: Negative.   Neurological: Negative.      Objective:   Filed Vitals:   04/12/14 1618  BP: 94/53  Pulse: 73  Temp: 98.2 F (36.8 C)  Resp: 20   Physical Exam  Constitutional: George Fuller is oriented to person, place, and time.  Neck: Normal range of motion. Neck supple.  Cardiovascular: Normal rate, regular rhythm and normal heart sounds.   Pulmonary/Chest: Effort normal and breath sounds normal.  Abdominal: Soft. Bowel sounds are normal. George Fuller exhibits distension (ascites improving). There is tenderness (right side).  Musculoskeletal: George Fuller exhibits no edema (generalized edema resolved).  Neurological: George Fuller is alert and oriented to person, place, and time.  Skin: Skin is warm and dry.  Psychiatric:  Patient tearful during exam      Lab Results  Component Value Date   WBC 6.0 02/12/2014   HGB 9.6* 02/12/2014   HCT 29.8* 02/12/2014  MCV 94.9 02/12/2014   PLT 87* 02/12/2014   Lab Results  Component Value Date   CREATININE 0.98 02/23/2014   BUN 14 02/23/2014   NA 134* 02/23/2014   K 4.4 02/23/2014   CL 101 02/23/2014   CO2 26 02/23/2014    No results found for this basename: HGBA1C   Lipid Panel     Component Value Date/Time   CHOL 64 02/10/2014 2023   TRIG 50 02/10/2014 2023   HDL 23* 02/10/2014 2023   CHOLHDL 2.8 02/10/2014 2023   VLDL 10 02/10/2014 2023   LDLCALC 31 02/10/2014 2023       Assessment and plan:   George Fuller was seen today for follow-up and hepatic cancer.  Diagnoses and associated orders for this visit:  George Fuller (hepatocellular carcinoma) - spironolactone (ALDACTONE) 50 MG tablet; Take 1 tablet (50 mg total) by mouth daily.  Liver transplant  candidate - PPD - Tdap vaccine greater than or equal to 7yo IM - Ambulatory referral to Dentistry  Insomnia - Stop melatonin and begin traZODone (DESYREL) 25 mg TABS tablet; Take 0.5 tablets (25 mg total) by mouth at bedtime.  Depression - Trazodone will help with depression as well.    Hemorrhoids, unspecified hemorrhoid type - hydrocortisone (ANUSOL-HC) 2.5 % rectal cream; Place 1 application rectally 2 (two) times daily.  Explained that inderal should help with hemorrhoid formation since it works on portal hypertension.    Return in about 3 months (around 07/13/2014) for Hepatic carcinoma.      Chari Manning, [place] and [facility]ness [phone] 04/18/2014, 4:30 PM

## 2014-04-12 NOTE — Progress Notes (Signed)
Patient presents for F/U liver CA. States being evaluated for liver transplant at [facility].  C/O 2 week history of hemorrhoids. Pt referred to SW for PHQ-9 score. States he was not ready for counseling at last visit but now is. Magalie Almon aware.

## 2014-04-14 ENCOUNTER — Ambulatory Visit: Payer: Medicaid Other | Attending: Internal Medicine

## 2014-04-14 LAB — TB SKIN TEST
INDURATION: 0 mm
TB Skin Test: NEGATIVE

## 2014-04-30 ENCOUNTER — Telehealth: Payer: Self-pay | Admitting: Internal Medicine

## 2014-04-30 NOTE — Telephone Encounter (Signed)
Pt was seen last week and was told about needing an x-ray for his right wrist.  Pt has not heard back about x-ray appt date/time, please f/u with info.

## 2014-05-13 ENCOUNTER — Ambulatory Visit: Payer: Medicaid Other | Attending: Internal Medicine | Admitting: Internal Medicine

## 2014-05-13 ENCOUNTER — Encounter: Payer: Self-pay | Admitting: Internal Medicine

## 2014-05-13 VITALS — BP 97/59 | HR 62 | Temp 98.4°F | Resp 14 | Ht 71.0 in | Wt 183.8 lb

## 2014-05-13 DIAGNOSIS — B1920 Unspecified viral hepatitis C without hepatic coma: Secondary | ICD-10-CM | POA: Diagnosis not present

## 2014-05-13 DIAGNOSIS — M25531 Pain in right wrist: Secondary | ICD-10-CM

## 2014-05-13 DIAGNOSIS — K7460 Unspecified cirrhosis of liver: Secondary | ICD-10-CM | POA: Insufficient documentation

## 2014-05-13 DIAGNOSIS — M25539 Pain in unspecified wrist: Secondary | ICD-10-CM

## 2014-05-13 DIAGNOSIS — Z87891 Personal history of nicotine dependence: Secondary | ICD-10-CM | POA: Diagnosis not present

## 2014-05-13 NOTE — Progress Notes (Signed)
Patient ID: George Fuller, male   DOB: 12/21/1955, 58 y.o.   MRN: [id number]  CC: r wrist pain  HPI:  Patient presents today for c/o right wrist pain from an injury 4 months ago.  Patient reports that he jammed his wrist when he fell off a piece of wood.  He states that he has noticed more swelling and pain over the past few days.  He reports that it is more painful to bend back or to sign his name.  He has tried advil for pain.   No Known Allergies Past Medical History  Diagnosis Date  . Varices, esophageal   . Clostridium difficile infection   . Liver cirrhosis   . Hepatitis C   . HCC (hepatocellular carcinoma) 02/12/2014  . Ascites    Current Outpatient Prescriptions on File Prior to Visit  Medication Sig Dispense Refill  . furosemide (LASIX) 40 MG tablet Take 1 tablet (40 mg total) by mouth daily.  30 tablet  3  . hydrocortisone (ANUSOL-HC) 2.5 % rectal cream Place 1 application rectally 2 (two) times daily.  30 g  2  . oxyCODONE (OXY IR/ROXICODONE) 5 MG immediate release tablet Take 1 tablet (5 mg total) by mouth every 4 (four) hours as needed for severe pain.  30 tablet  0  . propranolol (INDERAL) 20 MG tablet Take 20 mg by mouth 2 (two) times daily.      Marland Kitchen spironolactone (ALDACTONE) 50 MG tablet Take 1 tablet (50 mg total) by mouth daily.  30 tablet  4  . traZODone (DESYREL) 25 mg TABS tablet Take 0.5 tablets (25 mg total) by mouth at bedtime.  30 tablet  2   No current facility-administered medications on file prior to visit.   Family History  Problem Relation Age of Onset  . COPD Mother   . Alcohol abuse Father    History   Social History  . Marital Status: Married    Spouse Name: N/A    Number of Children: N/A  . Years of Education: N/A   Occupational History  . Not on file.   Social History Main Topics  . Smoking status: Smoker, Current Status Unknown -- 1.00 packs/day for 30 years    Types: Cigarettes  . Smokeless tobacco: Not on file     Comment: Patient states  last cig 04/11/2014  . Alcohol Use: No  . Drug Use: No  . Sexual Activity: Not on file   Other Topics Concern  . Not on file   Social History Narrative  . No narrative on file    Review of Systems: Constitutional: Negative for fever, chills, diaphoresis, activity change, appetite change and fatigue. HENT: Negative for ear pain, nosebleeds, congestion, facial swelling, rhinorrhea, neck pain, neck stiffness and ear discharge.  Eyes: Negative for pain, discharge, redness, itching and visual disturbance. Respiratory: Negative for cough, choking, chest tightness, shortness of breath, wheezing and stridor.  Cardiovascular: Negative for chest pain, palpitations and leg swelling. Gastrointestinal: Negative for abdominal distention. Genitourinary: Negative for dysuria, urgency, frequency, hematuria, flank pain, decreased urine volume, difficulty urinating and dyspareunia.  Musculoskeletal: Negative for back pain, joint swelling, arthralgias and gait problem. Neurological: Negative for dizziness, tremors, seizures, syncope, facial asymmetry, speech difficulty, weakness, light-headedness, numbness and headaches.  Hematological: Negative for adenopathy. Does not bruise/bleed easily. Psychiatric/Behavioral: Negative for hallucinations, behavioral problems, confusion, dysphoric mood, decreased concentration and agitation.    Objective:   Filed Vitals:   05/13/14 1045  BP: 97/59  Pulse: 62  Temp:  98.4 F (36.9 C)  Resp: 14    Physical Exam: Constitutional: Patient appears well-developed and well-nourished. No distress. CVS: RRR, S1/S2 +, no murmurs, no gallops, no carotid bruit.  Pulmonary: Effort and breath sounds normal, no stridor, rhonchi, wheezes, rales.  Abdominal: Soft. BS +,  no distension, tenderness, rebound or guarding.  Musculoskeletal:. No edema. Pain with flexion and extension of right wrist. Skin: Skin is warm and dry. No rash noted. Not diaphoretic. No erythema. No  pallor. Psychiatric: Normal mood and affect. Behavior, judgment, thought content normal.  Lab Results  Component Value Date   WBC 6.0 02/12/2014   HGB 9.6* 02/12/2014   HCT 29.8* 02/12/2014   MCV 94.9 02/12/2014   PLT 87* 02/12/2014   Lab Results  Component Value Date   CREATININE 0.98 02/23/2014   BUN 14 02/23/2014   NA 134* 02/23/2014   K 4.4 02/23/2014   CL 101 02/23/2014   CO2 26 02/23/2014    No results found for this basename: HGBA1C   Lipid Panel     Component Value Date/Time   CHOL 64 02/10/2014 2023   TRIG 50 02/10/2014 2023   HDL 23* 02/10/2014 2023   CHOLHDL 2.8 02/10/2014 2023   VLDL 10 02/10/2014 2023   LDLCALC 31 02/10/2014 2023       Assessment and plan:   Westley was seen today for wrist injury.  Diagnoses and associated orders for this visit:  Wrist pain, acute, right - DG Wrist Complete Right; Future   RTC if symptoms worsen or fail to improve       Chari Manning, [facility] [phone] 05/16/2014, 11:48 PM

## 2014-05-13 NOTE — Patient Instructions (Signed)
Wrist Exercises RANGE OF MOTION (ROM) AND STRETCHING EXERCISES - Wrist Sprain  These exercises may help you when beginning to rehabilitate your injury. Your symptoms may resolve with or without further involvement from your physician, physical therapist, or athletic trainer. While completing these exercises, remember:   Restoring tissue flexibility helps normal motion to return to the joints. This allows healthier, less painful movement and activity.  An effective stretch should be held for at least 30 seconds.  A stretch should never be painful. You should only feel a gentle lengthening or release in the stretched tissue. RANGE OF MOTION - Wrist Flexion, Active-Assisted  Extend your right / left elbow with your palm pointing down.*  Gently pull the back of your hand toward you until you feel a gentle stretch on the top of your forearm.  Hold this position for __________ seconds. Repeat __________ times. Complete this exercise __________ times per day.  *If directed by your physician, physical therapist, or athletic trainer, complete this stretch with your elbow bent rather than extended. RANGE OF MOTION - Wrist Extension, Active-Assisted   Extend your right / left elbow and turn your palm upward.*  Gently pull your palm/fingertips back so your wrist extends and your fingers point more toward the ground.  You should feel a gentle stretch on the inside of your forearm.  Hold this position for __________ seconds. Repeat __________ times. Complete this exercise __________ times per day. *If directed by your physician, physical therapist, or athletic trainer, complete this stretch with your elbow bent, rather than extended. RANGE OF MOTION - Supination, Active   Stand or sit with your elbows at your side. Bend your right / left elbow to 90 degrees.  Turn your palm upward until you feel a gentle stretch on the inside of your forearm.  Hold this position for __________ seconds. Slowly  release and return to the starting position. Repeat __________ times. Complete this stretch __________ times per day.  RANGE OF MOTION - Pronation, Active   Stand or sit with your elbows at your side. Bend your right / left elbow to 90 degrees.  Turn your palm downward until you feel a gentle stretch on the top of your forearm.  Hold this position for __________ seconds. Slowly release and return to the starting position. Repeat __________ times. Complete this stretch __________ times per day.  STRENGTHENING EXERCISES  These exercises may help you when beginning to rehabilitate your injury. They may resolve your symptoms with or without further involvement from your physician, physical therapist, or athletic trainer. While completing these exercises, remember:   Muscles can gain both the endurance and the strength needed for everyday activities through controlled exercises.  Complete these exercises as instructed by your physician, physical therapist, or athletic trainer. Progress the resistance and repetitions only as guided.  You may experience muscle soreness or fatigue, but the pain or discomfort you are trying to eliminate should never worsen during these exercises. If this pain does worsen, stop and make certain you are following the directions exactly. If the pain is still present after adjustments, discontinue the exercise until you can discuss the trouble with your clinician. STRENGTH - Wrist Flexors  Sit with your right / left forearm palm-up and fully supported. Your elbow should be resting below the height of your shoulder. Allow your wrist to extend over the edge of the surface.  Loosely holding a __________ weight or a piece of rubber exercise band/tubing, slowly curl your hand up toward your forearm.    Hold this position for __________ seconds. Slowly lower the wrist back to the starting position in a controlled manner. Repeat __________ times. Complete this exercise __________  times per day.  STRENGTH - Wrist Extensors  Sit with your right / left forearm palm-down and fully supported. Your elbow should be resting below the height of your shoulder. Allow your wrist to extend over the edge of the surface.  Loosely holding a __________ weight or a piece of rubber exercise band/tubing, slowly curl your hand up toward your forearm.  Hold this position for __________ seconds. Slowly lower the wrist back to the starting position in a controlled manner. Repeat __________ times. Complete this exercise __________ times per day.  STRENGTH - Forearm Supinators  Sit with your right / left forearm supported on a table, keeping your elbow below shoulder height. Rest your hand over the edge, palm down.  Gently grip a hammer or a soup ladle.  Without moving your elbow, slowly turn your palm and hand upward to a "thumbs-up" position.  Hold this position for __________ seconds. Slowly return to the starting position. Repeat __________ times. Complete this exercise __________ times per day.  STRENGTH - Forearm Pronators   Sit with your right / left forearm supported on a table, keeping your elbow below shoulder height. Rest your hand over the edge, palm up.  Gently grip a hammer or a soup ladle.  Without moving your elbow, slowly turn your palm and hand upward to a "thumbs-up" position.  Hold this position for __________ seconds. Slowly return to the starting position. Repeat __________ times. Complete this exercise __________ times per day.  STRENGTH - Grip  Grasp a tennis ball, a dense sponge, or a large, rolled sock in your hand.  Squeeze as hard as you can without increasing any pain.  Hold this position for __________ seconds. Release your grip slowly. Repeat __________ times. Complete this exercise __________ times per day.  Document Released: 08/15/2005 Document Revised: 02/15/2014 Document Reviewed: 01/13/2009 Exit[employer] Patient Information 2015 [employer].  This information is not intended to replace advice given to you by your health care provider. Make sure you discuss any questions you have with your health care provider.  

## 2014-05-13 NOTE — Progress Notes (Signed)
Patient presents for right wrist pain; rates 2/10 at present States jammed right wrist 4 months ago States grip is weak and pain increases in evening after using all day

## 2014-05-17 ENCOUNTER — Ambulatory Visit (HOSPITAL_COMMUNITY)
Admission: RE | Admit: 2014-05-17 | Discharge: 2014-05-17 | Disposition: A | Discharge status: Home / Self Care | Payer: Medicaid Other | Source: Ambulatory Visit | Attending: Internal Medicine | Admitting: Internal Medicine

## 2014-05-17 DIAGNOSIS — M25531 Pain in right wrist: Secondary | ICD-10-CM

## 2014-05-17 DIAGNOSIS — M25539 Pain in unspecified wrist: Secondary | ICD-10-CM | POA: Diagnosis present

## 2014-05-18 ENCOUNTER — Telehealth: Payer: Self-pay | Admitting: Emergency Medicine

## 2014-05-18 DIAGNOSIS — M25531 Pain in right wrist: Secondary | ICD-10-CM

## 2014-05-18 NOTE — Telephone Encounter (Signed)
Pt given xray results. States he is still experiencing pain in right wrist. Sports med referral placed

## 2014-05-18 NOTE — Telephone Encounter (Signed)
Message copied by Ricci Barker on Tue May 18, 2014  4:25 PM ------      Message from: Chari Manning A      Created: Mon May 17, 2014 11:41 PM       X-ray just shows degenerative changes but no fracture. If he is still having pain he may be referred to sports medicine. ------

## 2014-05-20 ENCOUNTER — Ambulatory Visit (INDEPENDENT_AMBULATORY_CARE_PROVIDER_SITE_OTHER): Payer: Medicaid Other | Admitting: Family Medicine

## 2014-05-20 VITALS — BP 115/72 | HR 57 | Ht 71.0 in | Wt 176.0 lb

## 2014-05-20 DIAGNOSIS — S6990XA Unspecified injury of unspecified wrist, hand and finger(s), initial encounter: Secondary | ICD-10-CM

## 2014-05-20 DIAGNOSIS — S59919A Unspecified injury of unspecified forearm, initial encounter: Secondary | ICD-10-CM

## 2014-05-20 DIAGNOSIS — S59909A Unspecified injury of unspecified elbow, initial encounter: Secondary | ICD-10-CM

## 2014-05-20 DIAGNOSIS — S6991XA Unspecified injury of right wrist, hand and finger(s), initial encounter: Secondary | ICD-10-CM

## 2014-05-20 NOTE — Patient Instructions (Signed)
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 These are treated conservatively though occasionally require surgery if you don't improve. Wrist brace is the most important part of treatment - wear as often as possible even when sleeping. Icing as needed 15 minutes at a time. Cortisone injection is an option. MRI with contrast can confirm diagnosis but is typically done prior to surgery. Follow up with me in 6 weeks or as needed if you are doing well.

## 2014-05-24 ENCOUNTER — Encounter: Payer: Self-pay | Admitting: Family Medicine

## 2014-05-24 DIAGNOSIS — S6991XA Unspecified injury of right wrist, hand and finger(s), initial encounter: Secondary | ICD-10-CM | POA: Insufficient documentation

## 2014-05-24 NOTE — Assessment & Plan Note (Signed)
2/2 TFCC tear, classic presentation.  Unfortunately is over 3 months and still having issues.  Will start with conservative care - wrist brace as often as possible, icing.  Consider injection, MRI arthrogram (typically only done prior to considering surgery).  F/u in 6 weeks or as needed if doing well.

## 2014-05-24 NOTE — Progress Notes (Signed)
Patient ID: George Fuller, male   DOB: 04-May-1956, 58 y.o.   MRN: [id number]  PCP and referred by: Chari Manning, NP  Subjective:   HPI: Patient is a 58 y.o. male here for right wrist injury.  Patient reports on 4/19 he was on stairs that broke causing him to fall through them. When doing so he went to brace himself and thinks right wrist bent backwards. + swelling that has improved. Now has a painful click with extension, ulnar deviation of this wrist. Is right handed. Going to have surgery for [facility] tomorrow.  Past Medical History  Diagnosis Date  . Varices, esophageal   . Clostridium difficile infection   . Liver cirrhosis   . Hepatitis C   . HCC (hepatocellular carcinoma) 02/12/2014  . Ascites     Current Outpatient Prescriptions on File Prior to Visit  Medication Sig Dispense Refill  . furosemide (LASIX) 40 MG tablet Take 1 tablet (40 mg total) by mouth daily.  30 tablet  3  . oxyCODONE (OXY IR/ROXICODONE) 5 MG immediate release tablet Take 1 tablet (5 mg total) by mouth every 4 (four) hours as needed for severe pain.  30 tablet  0  . propranolol (INDERAL) 20 MG tablet Take 20 mg by mouth 2 (two) times daily.      . traZODone (DESYREL) 25 mg TABS tablet Take 0.5 tablets (25 mg total) by mouth at bedtime.  30 tablet  2  . hydrocortisone (ANUSOL-HC) 2.5 % rectal cream Place 1 application rectally 2 (two) times daily.  30 g  2  . spironolactone (ALDACTONE) 50 MG tablet Take 1 tablet (50 mg total) by mouth daily.  30 tablet  4   No current facility-administered medications on file prior to visit.    Past Surgical History  Procedure Laterality Date  . Esophagogastroduodenoscopy N/A 01/28/2014    Procedure: ESOPHAGOGASTRODUODENOSCOPY (EGD);  Surgeon: Beryle Beams, MD;  Location: [facility] ENDOSCOPY;  Service: Endoscopy;  Laterality: N/A;  . Esophagogastroduodenoscopy N/A 02/12/2014    Procedure: ESOPHAGOGASTRODUODENOSCOPY (EGD);  Surgeon: Beryle Beams, MD;  Location: [facility] ENDOSCOPY;   Service: Endoscopy;  Laterality: N/A;  . Esophageal banding N/A 02/12/2014    Procedure: ESOPHAGEAL BANDING;  Surgeon: Beryle Beams, MD;  Location: [facility];  Service: Endoscopy;  Laterality: N/A;    No Known Allergies  History   Social History  . Marital Status: Married    Spouse Name: N/A    Number of Children: N/A  . Years of Education: N/A   Occupational History  . Not on file.   Social History Main Topics  . Smoking status: Smoker, Current Status Unknown -- 1.00 packs/day for 30 years    Types: Cigarettes  . Smokeless tobacco: Not on file     Comment: Patient states last cig 04/11/2014  . Alcohol Use: No  . Drug Use: No  . Sexual Activity: Not on file   Other Topics Concern  . Not on file   Social History Narrative  . No narrative on file    Family History  Problem Relation Age of Onset  . COPD Mother   . Alcohol abuse Father     BP 115/72  Pulse 57  Ht 5\' 11"  (1.803 m)  Wt 176 lb (79.833 kg)  BMI 24.56 kg/m2  Review of Systems: See HPI above.    Objective:  Physical Exam:  Gen: NAD  Right wrist: Mild swelling dorsally.  No bruising, other deformity. TTP over TFCC.  No bony, other tenderness  of wrist. FROM with pain on extension and ulnar deviation - click felt at TFCC. Strength 5/5 with wrist motions and finger abduction, extension, thumb opposition. NVI distally.    Assessment & Plan:  1. Right wrist injury - 2/2 TFCC tear, classic presentation.  Unfortunately is over 3 months and still having issues.  Will start with conservative care - wrist brace as often as possible, icing.  Consider injection, MRI arthrogram (typically only done prior to considering surgery).  F/u in 6 weeks or as needed if doing well.

## 2014-08-02 ENCOUNTER — Encounter: Payer: Self-pay | Admitting: Internal Medicine

## 2014-08-02 ENCOUNTER — Ambulatory Visit: Payer: Medicaid Other | Attending: Internal Medicine | Admitting: Internal Medicine

## 2014-08-02 VITALS — BP 102/65 | HR 59 | Temp 98.3°F | Resp 18 | Ht 71.0 in | Wt 188.0 lb

## 2014-08-02 DIAGNOSIS — F329 Major depressive disorder, single episode, unspecified: Secondary | ICD-10-CM | POA: Diagnosis not present

## 2014-08-02 DIAGNOSIS — F17210 Nicotine dependence, cigarettes, uncomplicated: Secondary | ICD-10-CM | POA: Diagnosis not present

## 2014-08-02 DIAGNOSIS — C220 Liver cell carcinoma: Secondary | ICD-10-CM

## 2014-08-02 DIAGNOSIS — Z760 Encounter for issue of repeat prescription: Secondary | ICD-10-CM | POA: Diagnosis not present

## 2014-08-02 DIAGNOSIS — G4700 Insomnia, unspecified: Secondary | ICD-10-CM | POA: Insufficient documentation

## 2014-08-02 DIAGNOSIS — K7460 Unspecified cirrhosis of liver: Secondary | ICD-10-CM | POA: Diagnosis not present

## 2014-08-02 DIAGNOSIS — R188 Other ascites: Secondary | ICD-10-CM | POA: Insufficient documentation

## 2014-08-02 DIAGNOSIS — I8500 Esophageal varices without bleeding: Secondary | ICD-10-CM | POA: Diagnosis not present

## 2014-08-02 DIAGNOSIS — B1920 Unspecified viral hepatitis C without hepatic coma: Secondary | ICD-10-CM | POA: Diagnosis not present

## 2014-08-02 DIAGNOSIS — F32A Depression, unspecified: Secondary | ICD-10-CM

## 2014-08-02 LAB — COMPLETE METABOLIC PANEL WITH GFR
ALT: 175 U/L — ABNORMAL HIGH (ref 0–53)
AST: 202 U/L — ABNORMAL HIGH (ref 0–37)
Albumin: 3.5 g/dL (ref 3.5–5.2)
Alkaline Phosphatase: 155 U/L — ABNORMAL HIGH (ref 39–117)
BILIRUBIN TOTAL: 1.6 mg/dL — AB (ref 0.2–1.2)
BUN: 17 mg/dL (ref 6–23)
CO2: 24 mEq/L (ref 19–32)
Calcium: 9.1 mg/dL (ref 8.4–10.5)
Chloride: 103 mEq/L (ref 96–112)
Creat: 1.01 mg/dL (ref 0.50–1.35)
GFR, Est African American: >89 mL/min
GFR, Est Non African American: 81 mL/min
Glucose, Bld: 80 mg/dL (ref 70–99)
Potassium: 4.8 mEq/L (ref 3.5–5.3)
Sodium: 135 mEq/L (ref 135–145)
Total Protein: 7.8 g/dL (ref 6.0–8.3)

## 2014-08-02 MED ORDER — OXYCODONE HCL 5 MG PO TABS: 5.0000 mg | ORAL_TABLET | Freq: Four times a day (QID) | ORAL | Status: DC | PRN

## 2014-08-02 MED ORDER — TRAZODONE 25 MG HALF TABLET: 25.0000 mg | ORAL_TABLET | Freq: Every day | ORAL | Status: DC

## 2014-08-02 MED ORDER — FUROSEMIDE 40 MG PO TABS: 40.0000 mg | ORAL_TABLET | Freq: Every day | ORAL | Status: DC

## 2014-08-02 MED ORDER — SPIRONOLACTONE 50 MG PO TABS: 50.0000 mg | ORAL_TABLET | Freq: Every day | ORAL | Status: DC

## 2014-08-02 NOTE — Progress Notes (Signed)
Patient ID: George Fuller, male   DOB: 14-Nov-1955, 58 y.o.   MRN: [id number]  CC: Followup  HPI:  Patient presents to clinic today for a followup of hepatocellular carcinoma.  Patient reports that he had a liver ablation back in September of this year and his repeat MRI showed he still had a spot on his liver after ablation.  He reports that his next followup MRI is scheduled for December and if the lesion is gone he will be a candidate for hepatitis C treatment.  His oncologist at [facility] placed him on Wellbutrin 150 mg twice a day to help with depression and smoking cessation.  Patient states that the only Medicaid will pay for his hepatitis C treatment is for him to quit smoking.  He reports that his oncologist has set him up with a counselor for his depression.  Patient still complains of difficulty sleeping at night with trazodone.                                                                                      Found that he had a torn ligament in his wrist, been in brace and may need surgery    No Known Allergies Past Medical History  Diagnosis Date  . Varices, esophageal   . Clostridium difficile infection   . Liver cirrhosis   . Hepatitis C   . HCC (hepatocellular carcinoma) 02/12/2014  . Ascites    Current Outpatient Prescriptions on File Prior to Visit  Medication Sig Dispense Refill  . furosemide (LASIX) 40 MG tablet Take 1 tablet (40 mg total) by mouth daily.  30 tablet  3  . oxyCODONE (OXY IR/ROXICODONE) 5 MG immediate release tablet Take 1 tablet (5 mg total) by mouth every 4 (four) hours as needed for severe pain.  30 tablet  0  . propranolol (INDERAL) 20 MG tablet Take 20 mg by mouth 2 (two) times daily.      Marland Kitchen spironolactone (ALDACTONE) 50 MG tablet Take 1 tablet (50 mg total) by mouth daily.  30 tablet  4  . traZODone (DESYREL) 25 mg TABS tablet Take 0.5 tablets (25 mg total) by mouth at bedtime.  30 tablet  2  . hydrocortisone (ANUSOL-HC) 2.5 % rectal cream Place 1 application  rectally 2 (two) times daily.  30 g  2   No current facility-administered medications on file prior to visit.   Family History  Problem Relation Age of Onset  . COPD Mother   . Alcohol abuse Father    History   Social History  . Marital Status: Married    Spouse Name: N/A    Number of Children: N/A  . Years of Education: N/A   Occupational History  . Not on file.   Social History Main Topics  . Smoking status: Smoker, Current Status Unknown -- 1.00 packs/day for 30 years    Types: Cigarettes  . Smokeless tobacco: Not on file     Comment: Patient states last cig 04/11/2014  . Alcohol Use: No  . Drug Use: No  . Sexual Activity: Not on file   Other Topics Concern  . Not on file   Social  History Narrative  . No narrative on file    Review of Systems  Constitutional:       Generalized pain  Gastrointestinal: Positive for abdominal pain (lower abdomen).  All other systems reviewed and are negative.     Objective:   Filed Vitals:   08/02/14 1000  BP: 102/65  Pulse: 59  Temp: 98.3 F (36.8 C)  Resp: 18    Physical Exam: Constitutional: Patient appears well-developed and well-nourished. No distress. HENT: Normocephalic, atraumatic, External right and left ear normal. Oropharynx is clear and moist.  Eyes: Conjunctivae and EOM are normal. PERRLA, no scleral icterus. Neck: Normal ROM. Neck supple. No JVD. No tracheal deviation. No thyromegaly. CVS: RRR, S1/S2 +, no murmurs, no gallops, no carotid bruit.  Pulmonary: Effort and breath sounds normal, no stridor, rhonchi, wheezes, rales.  Abdominal: Soft. BS +,  no distension, tenderness, rebound or guarding.  Musculoskeletal: Normal range of motion. No edema and no tenderness.  Lymphadenopathy: No lymphadenopathy noted, cervical, inguinal or axillary Neuro: Alert. Normal reflexes, muscle tone coordination. No cranial nerve deficit. Skin: Skin is warm and dry. No rash noted. Not diaphoretic. No erythema. No  pallor. Psychiatric: Normal mood and affect. Behavior, judgment, thought content normal.  Lab Results  Component Value Date   WBC 6.0 02/12/2014   HGB 9.6* 02/12/2014   HCT 29.8* 02/12/2014   MCV 94.9 02/12/2014   PLT 87* 02/12/2014   Lab Results  Component Value Date   CREATININE 0.98 02/23/2014   BUN 14 02/23/2014   NA 134* 02/23/2014   K 4.4 02/23/2014   CL 101 02/23/2014   CO2 26 02/23/2014    No results found for this basename: HGBA1C   Lipid Panel     Component Value Date/Time   CHOL 64 02/10/2014 2023   TRIG 50 02/10/2014 2023   HDL 23* 02/10/2014 2023   CHOLHDL 2.8 02/10/2014 2023   VLDL 10 02/10/2014 2023   LDLCALC 31 02/10/2014 2023      Assessment and plan:   Mike was seen today for follow-up.  Diagnoses and associated orders for this visit:  [place] (hepatocellular carcinoma) - Refill furosemide (LASIX) 40 MG tablet; Take 1 tablet (40 mg total) by mouth daily. - Refill spironolactone (ALDACTONE) 50 MG tablet; Take 1 tablet (50 mg total) by mouth daily. - Refill oxyCODONE (OXY IR/ROXICODONE) 5 MG immediate release tablet; Take 1 tablet (5 mg total) by mouth every 6 (six) hours as needed for severe pain. Patient states he only takes one per day - COMPLETE METABOLIC PANEL WITH GFR to check potassium level  Depression/ Insomnia - traZODone (DESYREL) 25 mg TABS tablet; Take 0.5 tablets (25 mg total) by mouth at bedtime. May continue. Explained may help to take medication early   Return in about 6 months (around 02/01/2015) for [facility].        Chari Manning, [facility] [phone] 08/02/2014, 10:21 AM

## 2014-08-02 NOTE — Progress Notes (Signed)
F/U Liver cancer and hep C Medicine refills Rx Oxycodone,Furosimide,

## 2014-09-28 ENCOUNTER — Telehealth: Payer: Self-pay | Admitting: Internal Medicine

## 2014-09-28 ENCOUNTER — Other Ambulatory Visit: Payer: Self-pay | Admitting: Internal Medicine

## 2014-09-28 DIAGNOSIS — C220 Liver cell carcinoma: Secondary | ICD-10-CM

## 2014-09-28 MED ORDER — OXYCODONE HCL 5 MG PO TABS: 5.0000 mg | ORAL_TABLET | Freq: Four times a day (QID) | ORAL | Status: DC | PRN

## 2014-09-28 NOTE — Telephone Encounter (Signed)
Pt's wife is calling to request a refill for oxyCODONE (OXY IR/ROXICODONE) 5 MG immediate release tablet, pt's wife states that the pt. Will be going out of town for a month and needs to get a refill to last him until pt. Returns. Please f/u with pt.

## 2014-12-06 ENCOUNTER — Encounter: Payer: Medicaid Other | Admitting: Internal Medicine

## 2014-12-10 ENCOUNTER — Telehealth: Payer: Self-pay | Admitting: Emergency Medicine

## 2014-12-10 ENCOUNTER — Ambulatory Visit: Payer: Medicaid Other | Attending: Internal Medicine | Admitting: Internal Medicine

## 2014-12-10 ENCOUNTER — Encounter: Payer: Self-pay | Admitting: Internal Medicine

## 2014-12-10 VITALS — BP 109/69 | HR 64 | Temp 97.9°F | Resp 16 | Ht 71.0 in | Wt 225.0 lb

## 2014-12-10 DIAGNOSIS — G4700 Insomnia, unspecified: Secondary | ICD-10-CM | POA: Diagnosis not present

## 2014-12-10 DIAGNOSIS — F329 Major depressive disorder, single episode, unspecified: Secondary | ICD-10-CM

## 2014-12-10 DIAGNOSIS — R109 Unspecified abdominal pain: Secondary | ICD-10-CM | POA: Insufficient documentation

## 2014-12-10 DIAGNOSIS — Z0001 Encounter for general adult medical examination with abnormal findings: Secondary | ICD-10-CM | POA: Diagnosis present

## 2014-12-10 DIAGNOSIS — C220 Liver cell carcinoma: Secondary | ICD-10-CM | POA: Insufficient documentation

## 2014-12-10 DIAGNOSIS — F32A Depression, unspecified: Secondary | ICD-10-CM

## 2014-12-10 DIAGNOSIS — N3001 Acute cystitis with hematuria: Secondary | ICD-10-CM | POA: Insufficient documentation

## 2014-12-10 DIAGNOSIS — Z0000 Encounter for general adult medical examination without abnormal findings: Secondary | ICD-10-CM | POA: Diagnosis not present

## 2014-12-10 DIAGNOSIS — B1920 Unspecified viral hepatitis C without hepatic coma: Secondary | ICD-10-CM | POA: Insufficient documentation

## 2014-12-10 DIAGNOSIS — Z87891 Personal history of nicotine dependence: Secondary | ICD-10-CM | POA: Insufficient documentation

## 2014-12-10 DIAGNOSIS — R195 Other fecal abnormalities: Secondary | ICD-10-CM

## 2014-12-10 DIAGNOSIS — K7460 Unspecified cirrhosis of liver: Secondary | ICD-10-CM | POA: Diagnosis not present

## 2014-12-10 DIAGNOSIS — M2550 Pain in unspecified joint: Secondary | ICD-10-CM | POA: Insufficient documentation

## 2014-12-10 DIAGNOSIS — R319 Hematuria, unspecified: Secondary | ICD-10-CM

## 2014-12-10 LAB — POCT URINALYSIS DIPSTICK
GLUCOSE UA: NEGATIVE
KETONES UA: NEGATIVE
Leukocytes, UA: NEGATIVE
Nitrite, UA: POSITIVE
Protein, UA: 30
SPEC GRAV UA: 1.025
Urobilinogen, UA: 2
pH, UA: 6

## 2014-12-10 MED ORDER — OXYCODONE HCL 5 MG PO TABS: 5.0000 mg | ORAL_TABLET | Freq: Four times a day (QID) | ORAL | Status: DC | PRN

## 2014-12-10 MED ORDER — TRAZODONE HCL 50 MG PO TABS: 50.0000 mg | ORAL_TABLET | Freq: Every evening | ORAL | Status: DC | PRN

## 2014-12-10 MED ORDER — CIPROFLOXACIN HCL 500 MG PO TABS: 500.0000 mg | ORAL_TABLET | Freq: Two times a day (BID) | ORAL | Status: AC

## 2014-12-10 MED ORDER — ZOSTER VACCINE LIVE 19400 UNT/0.65ML ~~LOC~~ SOLR: 0.6500 mL | Freq: Once | SUBCUTANEOUS | Status: AC

## 2014-12-10 NOTE — Progress Notes (Signed)
Patient ID: Noboru Bidinger, male   DOB: May 27, 1956, 59 y.o.   MRN: [id number]  CC:  HPI: Evens Meno is a 59 y.o. male here today for a follow up visit.  Patient has past medical history of Hep C, HCC, and esophageal varices.  He is presenting to clinic today for a physical exam.  He reports that he has constant achy pain of his joints in the morning and sharp shooting pains in his abdomen.    He has picked up weight since he quit smoking 6 months ago. He stopped wellbutrin due to side effects. On antiviral medications of his Hep C, levels are now undetectable. He has repeat MRI on 12/16/13. He states that he was told that he will possibly come off the aldactone. He was told that he was told to come through me to come off aldactone. Cirrhosis is causing tumors so he will still need a liver transplant.    Patient has No headache, No chest pain, No abdominal pain - No Nausea, No new weakness tingling or numbness, No Cough - SOB.  No Known Allergies Past Medical History  Diagnosis Date  . Varices, esophageal   . Clostridium difficile infection   . Liver cirrhosis   . Hepatitis C   . HCC (hepatocellular carcinoma) 02/12/2014  . Ascites    Current Outpatient Prescriptions on File Prior to Visit  Medication Sig Dispense Refill  . furosemide (LASIX) 40 MG tablet Take 1 tablet (40 mg total) by mouth daily. 30 tablet 4  . hydrocortisone (ANUSOL-HC) 2.5 % rectal cream Place 1 application rectally 2 (two) times daily. 30 g 2  . oxyCODONE (OXY IR/ROXICODONE) 5 MG immediate release tablet Take 1 tablet (5 mg total) by mouth every 6 (six) hours as needed for severe pain. 60 tablet 0  . propranolol (INDERAL) 20 MG tablet Take 20 mg by mouth 2 (two) times daily.    Marland Kitchen spironolactone (ALDACTONE) 50 MG tablet Take 1 tablet (50 mg total) by mouth daily. 30 tablet 4  . traZODone (DESYREL) 25 mg TABS tablet Take 0.5 tablets (25 mg total) by mouth at bedtime. 30 tablet 2  . buPROPion (ZYBAN) 150 MG 12 hr tablet  Take 150 mg by mouth 2 (two) times daily.     No current facility-administered medications on file prior to visit.   Family History  Problem Relation Age of Onset  . COPD Mother   . Alcohol abuse Father    History   Social History  . Marital Status: Married    Spouse Name: N/A  . Number of Children: N/A  . Years of Education: N/A   Occupational History  . Not on file.   Social History Main Topics  . Smoking status: Smoker, Current Status Unknown -- 1.00 packs/day for 30 years    Types: Cigarettes  . Smokeless tobacco: Not on file     Comment: Patient states last cig 04/11/2014  . Alcohol Use: No  . Drug Use: No  . Sexual Activity: Not on file   Other Topics Concern  . Not on file   Social History Narrative    Review of Systems: Constitutional: Negative for fever, chills, diaphoresis, activity change, appetite change and fatigue. HENT: Negative for ear pain, nosebleeds, congestion, facial swelling, rhinorrhea, neck pain, neck stiffness and ear discharge.  Eyes: Negative for pain, discharge, redness, itching and visual disturbance. Respiratory: Negative for cough, choking, chest tightness, shortness of breath, wheezing and stridor.  Cardiovascular: Negative for chest pain, palpitations and  leg swelling. Gastrointestinal: Negative for abdominal distention. Genitourinary: Negative for dysuria, urgency, frequency, hematuria, flank pain, decreased urine volume, difficulty urinating and dyspareunia.  Musculoskeletal: Negative for back pain, joint swelling, arthralgias and gait problem. Neurological: Negative for dizziness, tremors, seizures, syncope, facial asymmetry, speech difficulty, weakness, light-headedness, numbness and headaches.  Hematological: Negative for adenopathy. Does not bruise/bleed easily. Psychiatric/Behavioral: Negative for hallucinations, behavioral problems, confusion, dysphoric mood, decreased concentration and agitation.    Objective:   Filed Vitals:    12/10/14 1114  BP: 109/69  Pulse: 64  Temp: 97.9 F (36.6 C)  Resp: 16    Physical Exam: Constitutional: Patient appears well-developed and well-nourished. No distress. HENT: Normocephalic, atraumatic, External right and left ear normal. Oropharynx is clear and moist.  Eyes: Conjunctivae and EOM are normal. PERRLA, no scleral icterus. Neck: Normal ROM. Neck supple. No JVD. No tracheal deviation. No thyromegaly. CVS: RRR, S1/S2 +, no murmurs, no gallops, no carotid bruit.  Pulmonary: Effort and breath sounds normal, no stridor, rhonchi, wheezes, rales.  Abdominal: Soft. BS +,  no distension, tenderness, rebound or guarding.  Musculoskeletal: Normal range of motion. No edema and no tenderness.  Lymphadenopathy: No lymphadenopathy noted, cervical, inguinal or axillary Neuro: Alert. Normal reflexes, muscle tone coordination. No cranial nerve deficit. Skin: Skin is warm and dry. No rash noted. Not diaphoretic. No erythema. No pallor. Psychiatric: Normal mood and affect. Behavior, judgment, thought content normal.  Normal prostate  Lab Results  Component Value Date   WBC 6.0 02/12/2014   HGB 9.6* 02/12/2014   HCT 29.8* 02/12/2014   MCV 94.9 02/12/2014   PLT 87* 02/12/2014   Lab Results  Component Value Date   CREATININE 1.01 08/02/2014   BUN 17 08/02/2014   NA 135 08/02/2014   K 4.8 08/02/2014   CL 103 08/02/2014   CO2 24 08/02/2014    No results found for: HGBA1C Lipid Panel     Component Value Date/Time   CHOL 64 02/10/2014 2023   TRIG 50 02/10/2014 2023   HDL 23* 02/10/2014 2023   CHOLHDL 2.8 02/10/2014 2023   VLDL 10 02/10/2014 2023   LDLCALC 31 02/10/2014 2023       Assessment and plan:   Cheney was seen today for follow-up.  Diagnoses and all orders for this visit:  Annual physical exam Orders: -     POCT urinalysis dipstick -     Lipid panel; Future -     CBC; Future -     COMPLETE METABOLIC PANEL WITH GFR; Future -     PSA, Medicare; Future -      zoster vaccine live, PF, (ZOSTAVAX) 55732 UNT/0.65ML injection; Inject 19,400 Units into the skin once.  HCC (hepatocellular carcinoma) Orders: -    Refill oxyCODONE (OXY IR/ROXICODONE) 5 MG immediate release tablet; Take 1 tablet (5 mg total) by mouth every 6 (six) hours as needed for severe pain.  Insomnia/Depression Orders: -     traZODone (DESYREL) 50 MG tablet; Take 1 tablet (50 mg total) by mouth at bedtime as needed for sleep. Increased Trazodone to 50 mg from 25 mg  Acute cystitis with hematuria/Hematuria Orders: -     Urine culture -     Begin ciprofloxacin (CIPRO) 500 MG tablet; Take 1 tablet (500 mg total) by mouth 2 (two) times daily.  Heme positive stool Patients last colonoscopy was less than 6 months back and was negative    Return in about 1 week (around 12/17/2014) for Lab Visit.  Chari Manning, [facility] [phone] 12/10/2014, 11:34 AM'

## 2014-12-10 NOTE — Progress Notes (Signed)
Pt is here requesting a physical. Pt states that at night time his joints get achy and that he gets shooting pian in his abdomen.

## 2014-12-10 NOTE — Telephone Encounter (Signed)
Pt request refill on Trazadone

## 2014-12-12 LAB — URINE CULTURE

## 2014-12-24 ENCOUNTER — Ambulatory Visit: Payer: Medicaid Other | Attending: Internal Medicine

## 2014-12-24 DIAGNOSIS — Z0000 Encounter for general adult medical examination without abnormal findings: Secondary | ICD-10-CM

## 2014-12-24 LAB — LIPID PANEL
CHOL/HDL RATIO: 1.8 ratio
CHOLESTEROL: 113 mg/dL (ref 0–200)
HDL: 63 mg/dL (ref >=40)
LDL Cholesterol: 37 mg/dL (ref 0–99)
TRIGLYCERIDES: 67 mg/dL (ref <150)
VLDL: 13 mg/dL (ref 0–40)

## 2014-12-24 LAB — COMPLETE METABOLIC PANEL WITH GFR
ALBUMIN: 3.3 g/dL — AB (ref 3.5–5.2)
ALT: 10 U/L (ref 0–53)
AST: 23 U/L (ref 0–37)
Alkaline Phosphatase: 88 U/L (ref 39–117)
BILIRUBIN TOTAL: 1.6 mg/dL — AB (ref 0.2–1.2)
BUN: 14 mg/dL (ref 6–23)
CALCIUM: 8.6 mg/dL (ref 8.4–10.5)
CHLORIDE: 106 meq/L (ref 96–112)
CO2: 23 meq/L (ref 19–32)
Creat: 0.97 mg/dL (ref 0.50–1.35)
GFR, Est African American: >89 mL/min
GFR, Est Non African American: 84 mL/min
Glucose, Bld: 104 mg/dL — ABNORMAL HIGH (ref 70–99)
Potassium: 4.6 mEq/L (ref 3.5–5.3)
Sodium: 137 mEq/L (ref 135–145)
Total Protein: 6.4 g/dL (ref 6.0–8.3)

## 2014-12-25 LAB — CBC
HEMATOCRIT: 31.5 % — AB (ref 39.0–52.0)
Hemoglobin: 10.3 g/dL — ABNORMAL LOW (ref 13.0–17.0)
MCH: 34.1 pg — ABNORMAL HIGH (ref 26.0–34.0)
MCHC: 32.7 g/dL (ref 30.0–36.0)
MCV: 104.3 fL — ABNORMAL HIGH (ref 78.0–100.0)
MPV: 11.1 fL (ref 8.6–12.4)
PLATELETS: 58 10*3/uL — AB (ref 150–400)
RBC: 3.02 MIL/uL — AB (ref 4.22–5.81)
RDW: 14.3 % (ref 11.5–15.5)
WBC: 3.9 10*3/uL — AB (ref 4.0–10.5)

## 2014-12-25 LAB — PSA, MEDICARE: PSA: 0.33 ng/mL (ref <=4.00)

## 2014-12-29 ENCOUNTER — Telehealth: Payer: Self-pay | Admitting: *Deleted

## 2014-12-29 NOTE — Telephone Encounter (Signed)
-----   Message from Lance Bosch, NP sent at 12/27/2014  7:11 PM EDT ----- Patient is still anemic but it has improved slightly. All other labs are ok

## 2014-12-29 NOTE — Telephone Encounter (Signed)
Left VM to return my call

## 2014-12-31 ENCOUNTER — Telehealth: Payer: Self-pay | Admitting: *Deleted

## 2014-12-31 NOTE — Telephone Encounter (Signed)
Pt is aware of his lab results. 

## 2015-01-27 ENCOUNTER — Telehealth: Payer: Self-pay | Admitting: Internal Medicine

## 2015-01-27 NOTE — Telephone Encounter (Signed)
Pt has appt on 03/07/15 with Dr. Edwyna Ready at [facility] liver transplant clinic in [place], [place]: [phone], but pt needs referral from PCP because he has [facility] compass plan. Please f/u with pt once referral has been made,  pt can also be reached at [phone].

## 2015-02-04 ENCOUNTER — Encounter: Payer: Self-pay | Admitting: Internal Medicine

## 2015-02-04 NOTE — Telephone Encounter (Signed)
Patient called to request a med refill for oxyCODONE (OXY IR/ROXICODONE) 5 MG immediate release tablet. Please f/u with pt.

## 2015-02-09 NOTE — Telephone Encounter (Signed)
Patient called requesting medication refill for oxyCODONE (OXY IR/ROXICODONE) 5 MG immediate release tablet.  Patient would like to know if he needs to come in for visit. Please f/u with patient

## 2015-02-09 NOTE — Telephone Encounter (Signed)
Patient called to request a med refill for oxyCODONE (OXY IR/ROXICODONE) 5 MG immediate release tablet. Please f/u with pt.

## 2015-02-10 ENCOUNTER — Telehealth: Payer: Self-pay | Admitting: *Deleted

## 2015-02-10 DIAGNOSIS — C220 Liver cell carcinoma: Secondary | ICD-10-CM

## 2015-02-10 MED ORDER — OXYCODONE HCL 5 MG PO TABS: 5.0000 mg | ORAL_TABLET | Freq: Four times a day (QID) | ORAL | Status: DC | PRN

## 2015-02-10 NOTE — Telephone Encounter (Signed)
Pt calling to follow up on refill request, please f/u with pt.

## 2015-02-10 NOTE — Telephone Encounter (Signed)
Error-refill already sent

## 2015-02-10 NOTE — Telephone Encounter (Signed)
Pt needed a refill for his pain medication. I reorder the medication for the pt.

## 2015-03-15 ENCOUNTER — Other Ambulatory Visit: Payer: Self-pay | Admitting: Internal Medicine

## 2015-03-15 ENCOUNTER — Telehealth: Payer: Self-pay | Admitting: Internal Medicine

## 2015-03-16 NOTE — Telephone Encounter (Signed)
Patients wife called to request a med refill for furosemide (LASIX) 40 MG tablet for the patient. [facility]. Please f/u

## 2015-03-17 ENCOUNTER — Other Ambulatory Visit: Payer: Self-pay | Admitting: Internal Medicine

## 2015-03-17 NOTE — Telephone Encounter (Signed)
Patient's wife called requesting medication refill on furosemide (LASIX) 40 MG tablet. Patient wife states he is completely out of medication . Please f/u with patient

## 2015-03-17 NOTE — Telephone Encounter (Signed)
May refill. See if he has had his potassium level checked at [place] recently. If not have him come back in 1 month for a bmet. Please place future order.

## 2015-03-18 ENCOUNTER — Other Ambulatory Visit: Payer: Self-pay | Admitting: *Deleted

## 2015-03-18 DIAGNOSIS — E875 Hyperkalemia: Secondary | ICD-10-CM

## 2015-03-18 DIAGNOSIS — C220 Liver cell carcinoma: Secondary | ICD-10-CM

## 2015-03-18 MED ORDER — FUROSEMIDE 40 MG PO TABS: 40.0000 mg | ORAL_TABLET | Freq: Every day | ORAL | Status: DC

## 2015-03-18 NOTE — Telephone Encounter (Signed)
Called to inform patient that his Lasix has been refilled and should be at [facility] within the hour.  No one home so left HIPAA compliant message for him to call back.  When he calls please inform him that med has been refilled and that he needs to set up an appointment next week for lab work - order has been placed.

## 2015-04-11 ENCOUNTER — Ambulatory Visit: Payer: 59 | Attending: Internal Medicine | Admitting: Internal Medicine

## 2015-04-11 ENCOUNTER — Encounter: Payer: Self-pay | Admitting: Internal Medicine

## 2015-04-11 VITALS — BP 114/65 | HR 95 | Temp 97.1°F | Resp 16 | Wt 231.8 lb

## 2015-04-11 DIAGNOSIS — Z79891 Long term (current) use of opiate analgesic: Secondary | ICD-10-CM | POA: Diagnosis not present

## 2015-04-11 DIAGNOSIS — F329 Major depressive disorder, single episode, unspecified: Secondary | ICD-10-CM | POA: Diagnosis present

## 2015-04-11 DIAGNOSIS — I8500 Esophageal varices without bleeding: Secondary | ICD-10-CM

## 2015-04-11 DIAGNOSIS — C220 Liver cell carcinoma: Secondary | ICD-10-CM | POA: Diagnosis not present

## 2015-04-11 DIAGNOSIS — Z85528 Personal history of other malignant neoplasm of kidney: Secondary | ICD-10-CM | POA: Diagnosis not present

## 2015-04-11 DIAGNOSIS — K429 Umbilical hernia without obstruction or gangrene: Secondary | ICD-10-CM

## 2015-04-11 DIAGNOSIS — F17210 Nicotine dependence, cigarettes, uncomplicated: Secondary | ICD-10-CM | POA: Insufficient documentation

## 2015-04-11 DIAGNOSIS — Z8619 Personal history of other infectious and parasitic diseases: Secondary | ICD-10-CM | POA: Insufficient documentation

## 2015-04-11 LAB — BASIC METABOLIC PANEL
BUN: 12 mg/dL (ref 6–23)
CO2: 26 meq/L (ref 19–32)
Calcium: 9.4 mg/dL (ref 8.4–10.5)
Chloride: 103 mEq/L (ref 96–112)
Creat: 1.06 mg/dL (ref 0.50–1.35)
GLUCOSE: 181 mg/dL — AB (ref 70–99)
Potassium: 4.6 mEq/L (ref 3.5–5.3)
SODIUM: 137 meq/L (ref 135–145)

## 2015-04-11 MED ORDER — PROPRANOLOL HCL 20 MG PO TABS: 20.0000 mg | ORAL_TABLET | Freq: Two times a day (BID) | ORAL | Status: DC

## 2015-04-11 MED ORDER — OXYCODONE HCL 5 MG PO TABS: 5.0000 mg | ORAL_TABLET | Freq: Three times a day (TID) | ORAL | Status: DC | PRN

## 2015-04-11 NOTE — Patient Instructions (Signed)
I will call you with potassium level results. If it is low I will place you on potassium replacement.

## 2015-04-11 NOTE — Progress Notes (Signed)
  Pt present today for follow up on Depression. He also would like a refill on oxycodone and propranolol.

## 2015-04-11 NOTE — Progress Notes (Signed)
Patient ID: George Fuller, male   DOB: [birth year], 59 y.o.   MRN: [id number]  CC: f/u   HPI: George Fuller is a 59 y.o. male here today for a follow up visit.  Patient has past medical history of Hepatocellular carcinoma, esophageal varices, and depression. He reports that he has been feeling much better since he started treatment. He presents with a copy of his MRI from [facility]. The MRI reveals that no cancer is shown. He was told that he may not require liver transplant now and he will continue to be followed every 3 months. He is concerned because he has a umbilical hernia. The hernia is not causing him problems and it is reducible.  He states that he goes monthly to have potassium levels checked. He is due to go next week. He is currently on lasix but Aldactone has been discontinued. He reports that he finished Hep C treatment 2 months ago and his levels are undetectable.   Patient has No headache, No chest pain, No abdominal pain - No Nausea, No new weakness tingling or numbness, No Cough - SOB.  No Known Allergies Past Medical History  Diagnosis Date  . Varices, esophageal   . Clostridium difficile infection   . Liver cirrhosis   . Hepatitis C   . HCC (hepatocellular carcinoma) 02/12/2014  . Ascites    Current Outpatient Prescriptions on File Prior to Visit  Medication Sig Dispense Refill  . furosemide (LASIX) 40 MG tablet TAKE 1 TABLET BY MOUTH EVERY DAY 30 tablet 3  . hydrocortisone (ANUSOL-HC) 2.5 % rectal cream Place 1 application rectally 2 (two) times daily. 30 g 2  . oxyCODONE (OXY IR/ROXICODONE) 5 MG immediate release tablet Take 1 tablet (5 mg total) by mouth every 6 (six) hours as needed for severe pain. 60 tablet 0  . propranolol (INDERAL) 20 MG tablet Take 20 mg by mouth 2 (two) times daily.    . traZODone (DESYREL) 50 MG tablet Take 1 tablet (50 mg total) by mouth at bedtime as needed for sleep. 30 tablet 5  . ciprofloxacin (CIPRO) 500 MG tablet Take 1 tablet (500 mg  total) by mouth 2 (two) times daily. (Patient not taking: Reported on 04/11/2015) 20 tablet 0  . furosemide (LASIX) 40 MG tablet Take 1 tablet (40 mg total) by mouth daily. (Patient not taking: Reported on 04/11/2015) 30 tablet 4  . spironolactone (ALDACTONE) 50 MG tablet Take 1 tablet (50 mg total) by mouth daily. (Patient not taking: Reported on 04/11/2015) 30 tablet 4  . zoster vaccine live, PF, (ZOSTAVAX) 67341 UNT/0.65ML injection Inject 19,400 Units into the skin once. (Patient not taking: Reported on 04/11/2015) 1 each 0   No current facility-administered medications on file prior to visit.   Family History  Problem Relation Age of Onset  . COPD Mother   . Alcohol abuse Father    History   Social History  . Marital Status: Married    Spouse Name: N/A  . Number of Children: N/A  . Years of Education: N/A   Occupational History  . Not on file.   Social History Main Topics  . Smoking status: Smoker, Current Status Unknown -- 1.00 packs/day for 30 years    Types: Cigarettes  . Smokeless tobacco: Not on file     Comment: Patient states last cig 04/11/2014  . Alcohol Use: No  . Drug Use: No  . Sexual Activity: Not on file   Other Topics Concern  . Not on  file   Social History Narrative    Review of Systems  Gastrointestinal: Positive for abdominal pain. Negative for nausea and vomiting.  Psychiatric/Behavioral: Negative for depression. The patient has insomnia (improved with Trazodone).   All other systems reviewed and are negative.      Objective:   Filed Vitals:   04/11/15 0932  BP: 114/65  Pulse: 95  Temp: 97.1 F (36.2 C)  Resp: 16    Physical Exam  Cardiovascular: Normal rate, regular rhythm and normal heart sounds.   Pulmonary/Chest: Effort normal and breath sounds normal.  Abdominal: Soft. Bowel sounds are normal. He exhibits no distension. There is no tenderness. A hernia (umbilical) is present.     Lab Results  Component Value Date   WBC 3.9*  12/24/2014   HGB 10.3* 12/24/2014   HCT 31.5* 12/24/2014   MCV 104.3* 12/24/2014   PLT 58* 12/24/2014   Lab Results  Component Value Date   CREATININE 0.97 12/24/2014   BUN 14 12/24/2014   NA 137 12/24/2014   K 4.6 12/24/2014   CL 106 12/24/2014   CO2 23 12/24/2014    No results found for: HGBA1C Lipid Panel     Component Value Date/Time   CHOL 113 12/24/2014 0932   TRIG 67 12/24/2014 0932   HDL 63 12/24/2014 0932   CHOLHDL 1.8 12/24/2014 0932   VLDL 13 12/24/2014 0932   LDLCALC 37 12/24/2014 0932       Assessment and plan:   Demarkis was seen today for depression.  Diagnoses and all orders for this visit:  [place] (hepatocellular carcinoma) Orders: -    Refill oxyCODONE (OXY IR/ROXICODONE) 5 MG immediate release tablet; Take 1 tablet (5 mg total) by mouth every 8 (eight) hours as needed for severe pain. -     Basic Metabolic Panel  Esophageal varices Orders: -    Refill propranolol (INDERAL) 20 MG tablet; Take 1 tablet (20 mg total) by mouth 2 (two) times daily.  Umbilical hernia without obstruction and without gangrene I have given patient strict return precautions   Return in about 4 weeks (around 05/09/2015), or if symptoms worsen or fail to improve, for Lab Visit-bmet.       Chari Manning, [facility] [phone] 04/11/2015, 9:49 AM

## 2015-06-06 ENCOUNTER — Other Ambulatory Visit: Payer: Self-pay | Admitting: Internal Medicine

## 2015-06-06 DIAGNOSIS — C220 Liver cell carcinoma: Secondary | ICD-10-CM

## 2015-06-06 MED ORDER — OXYCODONE HCL 5 MG PO TABS: 5.0000 mg | ORAL_TABLET | Freq: Three times a day (TID) | ORAL | Status: DC | PRN

## 2015-06-07 ENCOUNTER — Other Ambulatory Visit: Payer: Self-pay | Admitting: Internal Medicine

## 2015-06-10 NOTE — Telephone Encounter (Signed)
Patient called requesting a medication refill for,  traZODone (DESYREL)      Please follow up.

## 2015-07-07 ENCOUNTER — Ambulatory Visit: Payer: 59 | Attending: Internal Medicine | Admitting: Pharmacist

## 2015-07-07 DIAGNOSIS — Z23 Encounter for immunization: Secondary | ICD-10-CM | POA: Diagnosis not present

## 2015-07-07 MED ORDER — HEPATITIS B VAC RECOMBINANT 20 MCG/ML IJ SUSP
1.0000 mL | Freq: Once | INTRAMUSCULAR | Status: AC
  Administered 2015-07-07: 20 ug via INTRAMUSCULAR

## 2015-07-07 NOTE — Patient Instructions (Signed)
Thanks for coming in today!  Schedule the third injection at least 2 months from now!  Hepatitis B Vaccine, Recombinant injection What is this medicine? HEPATITIS B VACCINE (hep uh TAHY tis B VAK seen) is a vaccine. It is used to prevent an infection with the hepatitis B virus. This medicine may be used for other purposes; ask your health care provider or pharmacist if you have questions. COMMON BRAND NAME(S): Engerix-B, Recombivax HB What should I tell my health care provider before I take this medicine? They need to know if you have any of these conditions: -fever, infection -heart disease -hepatitis B infection -immune system problems -kidney disease -an unusual or allergic reaction to vaccines, yeast, other medicines, foods, dyes, or preservatives -pregnant or trying to get pregnant -breast-feeding How should I use this medicine? This vaccine is for injection into a muscle. It is given by a health care professional. A copy of Vaccine Information Statements will be given before each vaccination. Read this sheet carefully each time. The sheet may change frequently. Talk to your pediatrician regarding the use of this medicine in children. While this drug may be prescribed for children as young as newborn for selected conditions, precautions do apply. Overdosage: If you think you have taken too much of this medicine contact a poison control center or emergency room at once. NOTE: This medicine is only for you. Do not share this medicine with others. What if I miss a dose? It is important not to miss your dose. Call your doctor or health care professional if you are unable to keep an appointment. What may interact with this medicine? -medicines that suppress your immune function like adalimumab, anakinra, infliximab -medicines to treat cancer -steroid medicines like prednisone or cortisone This list may not describe all possible interactions. Give your health care provider a list of all  the medicines, herbs, non-prescription drugs, or dietary supplements you use. Also tell them if you smoke, drink alcohol, or use illegal drugs. Some items may interact with your medicine. What should I watch for while using this medicine? See your health care provider for all shots of this vaccine as directed. You must have 3 shots of this vaccine for protection from hepatitis B infection. Tell your doctor right away if you have any serious or unusual side effects after getting this vaccine. What side effects may I notice from receiving this medicine? Side effects that you should report to your doctor or health care professional as soon as possible: -allergic reactions like skin rash, itching or hives, swelling of the face, lips, or tongue -breathing problems -confused, irritated -fast, irregular heartbeat -flu-like syndrome -numb, tingling pain -seizures -unusually weak or tired Side effects that usually do not require medical attention (report to your doctor or health care professional if they continue or are bothersome): -diarrhea -fever -headache -loss of appetite -muscle pain -nausea -pain, redness, swelling, or irritation at site where injected -tiredness This list may not describe all possible side effects. Call your doctor for medical advice about side effects. You may report side effects to FDA at 1-800-FDA-1088. Where should I keep my medicine? This drug is given in a hospital or clinic and will not be stored at home. NOTE: This sheet is a summary. It may not cover all possible information. If you have questions about this medicine, talk to your doctor, pharmacist, or health care provider.  2015, Elsevier/Gold Standard. (2014-02-01 13:26:01) Hepatitis B Hepatitis B is a viral infection of the liver. Over half the people  who become infected with hepatitis B never feel sick. However, some may later develop long-term liver disease (chronic hepatitis). There are 2 phases of the  disease: sudden (acute) and longstanding (chronic). CAUSES Hepatitis B is caused by the hepatitis B virus (HBV). It can enter the body by sharing needles contaminated with blood from an infected person, by sharing intimate items such as toothbrushes and razors, or by sex with an infected person. A baby can get HBV from its birth mother. A caregiver may also get it from exposure to the blood of an infected patient by way of a cut or needle stick.  SYMPTOMS Acute Phase Many cases of acute HBV infection are mild and cause few problems.Some people may not even realize they are sick.Symptoms in others may last a few weeks to several months and include:  Loss of appetite.  Feeling very tired.  Nausea.  Vomiting.  Abdominal pain.  Dark yellow urine.  Yellow skin and eyes (jaundice). Chronic Phase  About 5% of people who get HBV infection become "chronic carriers." They often have no symptoms, but the virus stays in their body. They may spread the virus to others and can get long-term liver disease. The younger a child is when the infection starts, the more likely that child will be a carrier.  About 25% of chronic HBV carriers get a disease called "chronic active hepatitis." These people may develop scarring of the liver (cirrhosis), liver failure, or liver cancer. DIAGNOSIS Your caregiver can do a blood test to see if you have the disease. TREATMENT Acute hepatitis B does not usually require any drug treatment. It is important to avoid medicines such as acetaminophen that may cause increasing liver damage.  Treatment with many antiviral drugs is available and recommended for some patients with hepatitis B infection. The goal is to reduce the risk of progressive chronic liver disease, transmission of infection to others, and other long-term complications such as cirrhosis, liver failure, and liver cancer. Drug treatment is often advised for people with:  Acute liver failure.  Clinical  complications of cirrhosis.  Cirrhosis or advanced fibrosis with high serum measurements of viral DNA.  Reactivation of chronic HBV after chemotherapy or immunosuppression. Immediate drug treatment is not often advised for patients who have chronic infection but normal liver enzyme tests or patients who have a positive hepatitis B DNA test in blood but no other signs of active infection.Patients may have other circumstances that suggest a need or potential benefit from drug treatment. Successful treatment currently requires taking treatment drugs over a long period of time. An injected drug (interferon) may be given daily, 3 times a week, or once weekly for up to 1 year. An oral drug treatment plan may require daily dosing for many years or indefinitely, in order to prevent infection reactivation and worsening of liver disease. Side effects from these drugs are common and some may be very serious. Your response to treatment must be carefully monitored by both you and your caregiver throughout the entire treatment period. PREVENTION Hepatitis B vaccine is highly effective in preventing a hepatitis B infection.The vaccine is recommended worldwide for all newborns of hepatitis B infected mothers, and in many countries for all newborns. Hepatitis B vaccine is also recommended in the [place] for other people at higher than normal risk of getting an infection, including:  Sexually active people with multiple sex partners.  Homosexual and bisexual men.  People who live with someone who has hepatitis B.  Injection drug users.  Healthcare workers.  Patients on chronic hemodialysis and patients who need repeated blood or blood product transfusions.  Patients with chronic liver disease due to any cause.  Unvaccinated people traveling to areas with high levels of local HBV infection.  Patients with diabetes. Hepatitis B immune globulin (HBIG) is often given with hepatitis B vaccine to people who have  been exposed to blood contaminated with HBV. The HBIG protects you from the virus for the first 1 to 3 months. After that, the hepatitis B vaccine takes over and gives you long-term protection. Your caregiver will help you decide whether and when to get these shots following exposure to HBV. Healthcare workers need to avoid injuries and wear appropriate protective equipment such as gloves, gowns, and face masks when performing invasive medical or nursing procedures.  HOME CARE INSTRUCTIONS   Rest when you feel tired, and eat when you are hungry.  Avoid a sexual relationship until advised otherwise by your caregiver.  Avoid activities that could expose other people to your blood. Examples include sharing a toothbrush, nail clippers, razors, and needles.  This infection is contagious. Follow your caregiver's instructions in order to avoid spread of the infection.  Do not take any medicines until your caregiver says it is okay. This includes over-the-counter drugs such as acetominophen that are usually taken for fever or pain. SEEK IMMEDIATE MEDICAL CARE IF:   You are unable to eat or drink.  You feel sick to your stomach (nauseous) or throw up (vomit).  You feel confused.  Jaundice becomes more severe.  You have trouble breathing, a rash, or swelling of the skin, throat, mouth, or face. You may be having an allergic reaction to the medicine in the shot.  You start twitching or shaking (seizure).  You become very sleepy or have trouble waking up. MAKE SURE YOU:   Understand these instructions.  Will watch your condition.  Will get help right away if you are not doing well or get worse. Document Released: 09/28/2000 Document Revised: 12/24/2011 Document Reviewed: 01/08/2014 [facility] Patient Information 2015 [place], [place]. This information is not intended to replace advice given to you by your health care provider. Make sure you discuss any questions you have with your health care  provider.

## 2015-07-15 ENCOUNTER — Telehealth: Payer: Self-pay | Admitting: Internal Medicine

## 2015-07-15 NOTE — Telephone Encounter (Signed)
Patient called requesting med refill on traZODone (DESYREL) 50 MG tablet. Patient would like for it to be sent to [facility]. Please f/u with pt.

## 2015-07-19 NOTE — Telephone Encounter (Signed)
Patient called requesting med refill on traZODone (DESYREL) 50 MG tablet. Patient would like for it to be sent to [facility]. Patient is completely out of this medication. Patient gives verbal permission for his wife, Lars Masson, to discuss any medications with the nurse. Please f/u with patient ASAP.

## 2015-07-21 ENCOUNTER — Other Ambulatory Visit: Payer: Self-pay | Admitting: Internal Medicine

## 2015-07-21 MED ORDER — TRAZODONE HCL 50 MG PO TABS: 50.0000 mg | ORAL_TABLET | Freq: Every evening | ORAL | Status: DC | PRN

## 2015-07-29 ENCOUNTER — Telehealth: Payer: Self-pay | Admitting: Internal Medicine

## 2015-07-29 NOTE — Telephone Encounter (Signed)
Patient called and requested a med refill for oxyCODONE (OXY IR/ROXICODONE) 5 MG immediate release tablet. Patient stated that he will be running out of medication next week. Please f/u

## 2015-08-02 NOTE — Telephone Encounter (Signed)
Pt. Called requesting a med refill on oxyCODONE (OXY IR/ROXICODONE) 5 MG immediate release tablet. Please f/u with pt.

## 2015-08-04 NOTE — Telephone Encounter (Signed)
Patient called and requested a med refill for oxyCODONE (OXY IR/ROXICODONE) 5 MG immediate release tablet

## 2015-08-05 ENCOUNTER — Telehealth: Payer: Self-pay

## 2015-08-05 DIAGNOSIS — C220 Liver cell carcinoma: Secondary | ICD-10-CM

## 2015-08-05 MED ORDER — OXYCODONE HCL 5 MG PO TABS: 5.0000 mg | ORAL_TABLET | Freq: Three times a day (TID) | ORAL | Status: DC | PRN

## 2015-08-05 NOTE — Telephone Encounter (Signed)
Patient has called several times requesting a refill on his oxycodone Are you willing to refill

## 2015-08-05 NOTE — Telephone Encounter (Signed)
May refill 

## 2015-08-12 NOTE — Telephone Encounter (Signed)
Pt. Came in requesting a med refill on furosemide (LASIX) 40 MG tablet. Please f/u with pt.

## 2015-08-16 NOTE — Telephone Encounter (Signed)
Set him up for a lab visit for a bmet to check his potassium levels. May refill medication once we have lab results. Thanks

## 2015-08-16 NOTE — Telephone Encounter (Signed)
Patient requesting refill on his lasix i see it was reported in June he was not taking it than refilled A few days later Can this be refilled

## 2015-08-17 NOTE — Telephone Encounter (Signed)
Patient called to check on the request for the med refill for furosemide (LASIX) 40 MG tablet. Please f/u with pt.

## 2015-08-22 ENCOUNTER — Telehealth: Payer: Self-pay

## 2015-08-22 DIAGNOSIS — Z0000 Encounter for general adult medical examination without abnormal findings: Secondary | ICD-10-CM

## 2015-08-22 NOTE — Telephone Encounter (Signed)
Returned patient phone call patient not available Left message on voice mail to return our call Patient will need to schedule a lab appt. Before we can refill his lasix

## 2015-09-26 ENCOUNTER — Telehealth: Payer: Self-pay | Admitting: Internal Medicine

## 2015-09-26 NOTE — Telephone Encounter (Signed)
Pt. Called requesting a med refill on oxyCODONE (OXY IR/ROXICODONE) 5 MG immediate release tablet. Please f/u with pt. °

## 2015-09-27 ENCOUNTER — Other Ambulatory Visit: Payer: Self-pay | Admitting: *Deleted

## 2015-09-27 DIAGNOSIS — C220 Liver cell carcinoma: Secondary | ICD-10-CM

## 2015-09-27 MED ORDER — OXYCODONE HCL 5 MG PO TABS: 5.0000 mg | ORAL_TABLET | Freq: Two times a day (BID) | ORAL | Status: DC | PRN

## 2015-09-27 NOTE — Telephone Encounter (Signed)
Rx at front office  Need office visit for future refills Pt notified

## 2015-10-02 IMAGING — CT CT ABD-PELV W/ CM
2 of 5 series · 15 of 46 positions shown, 17 images · IV contrast (Omni 300)
Comparison: None.

CLINICAL DATA: Throwing up blood, lower abdominal pain

EXAM:
CT ABDOMEN AND PELVIS WITH CONTRAST
TECHNIQUE: Multidetector CT imaging of the abdomen and pelvis was performed
using the standard protocol following bolus administration of
intravenous contrast.
CONTRAST:  100mL OMNIPAQUE IOHEXOL 300 MG/ML  SOLN

[Series 2: abd/ pelvis 5.0 i30f 1 · axial · 0.88mm/px · z∈[+1057,+1497]mm · 12 of 100 slices shown, 14 images]
[im 6/100  soft-tissue]
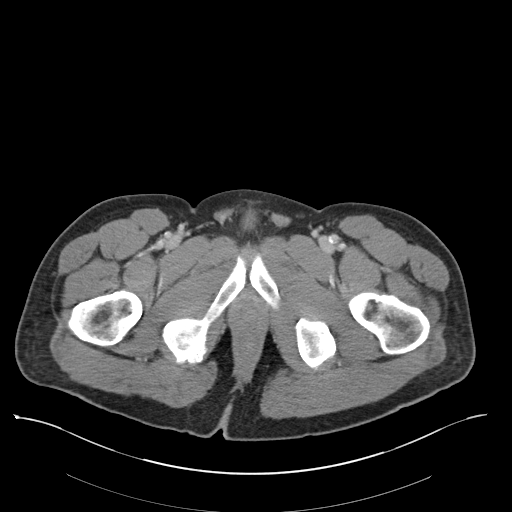
[im 6/100  bone]
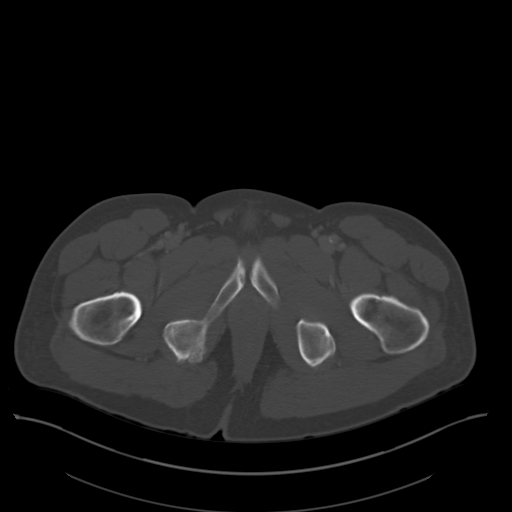
[im 16/100  soft-tissue]
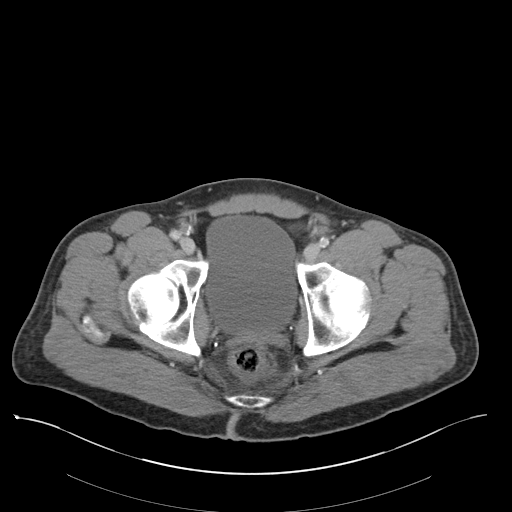
[im 21/100  soft-tissue]
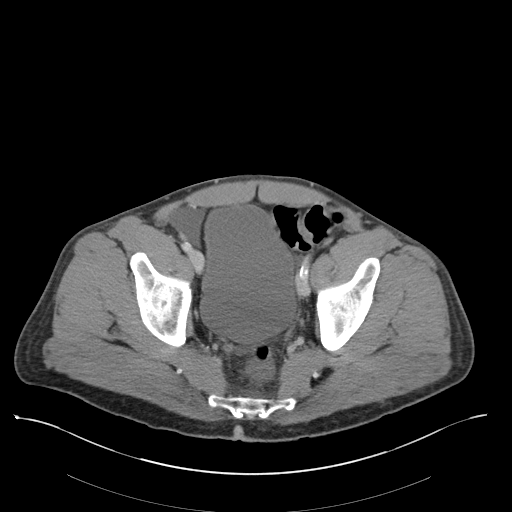
[im 32/100  soft-tissue]
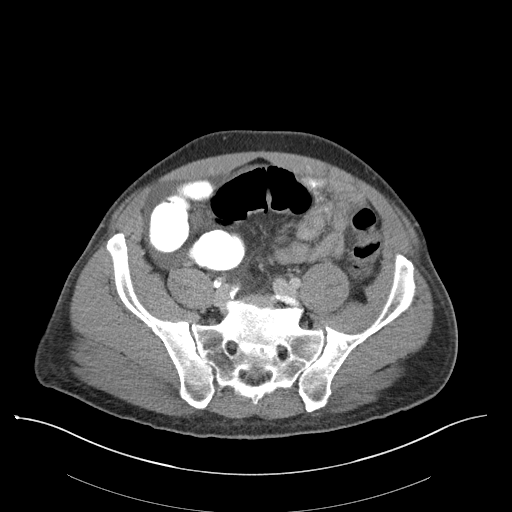
[im 37/100  soft-tissue]
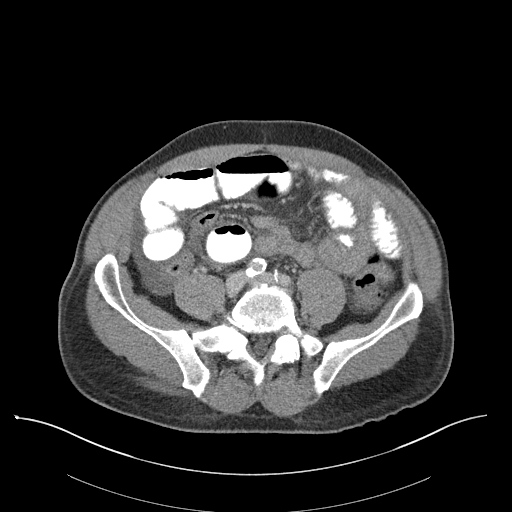
[im 47/100  soft-tissue]
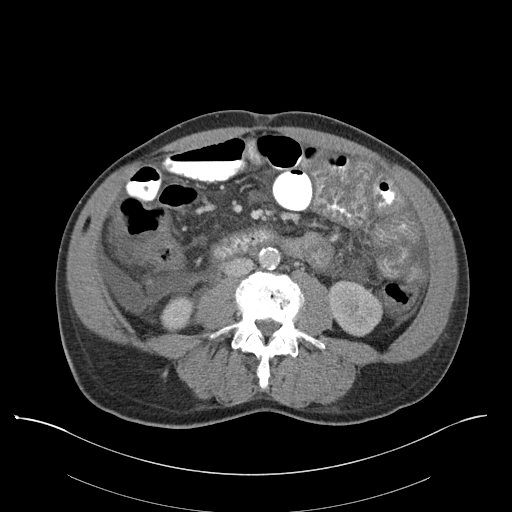
[im 53/100  soft-tissue]
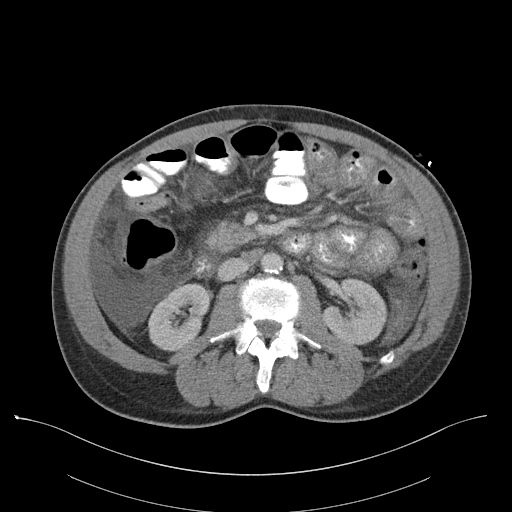
[im 63/100  soft-tissue]
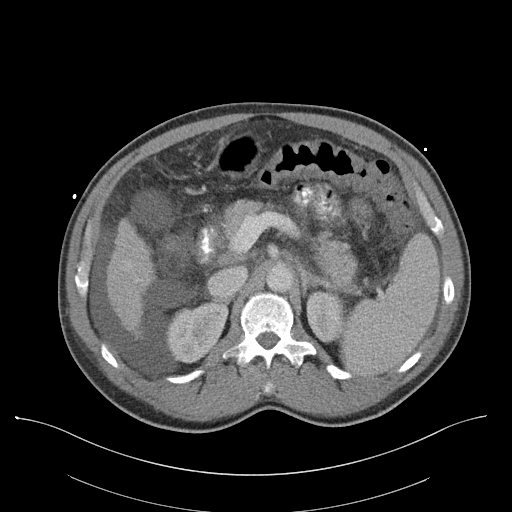
[im 68/100  soft-tissue]
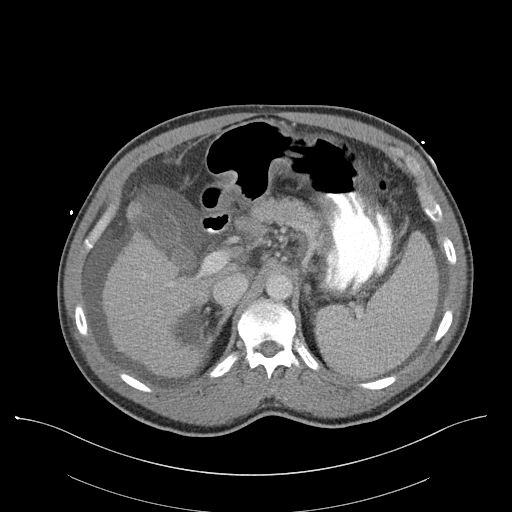
[im 68/100  bone]
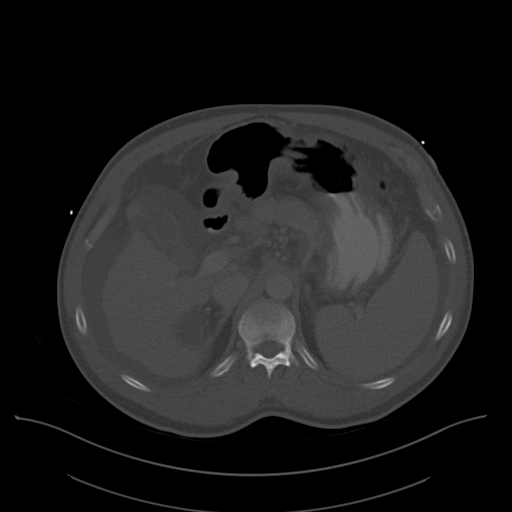
[im 79/100  soft-tissue]
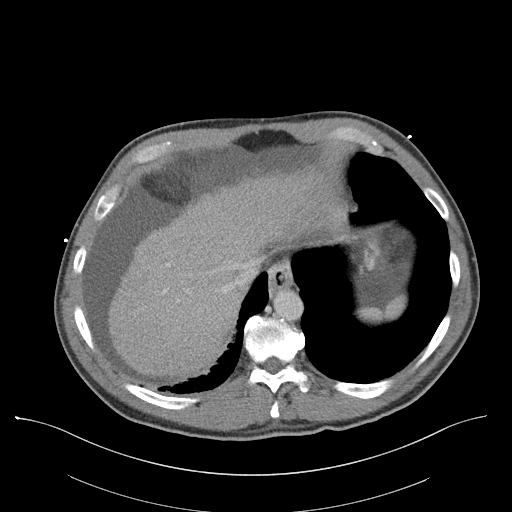
[im 84/100  soft-tissue]
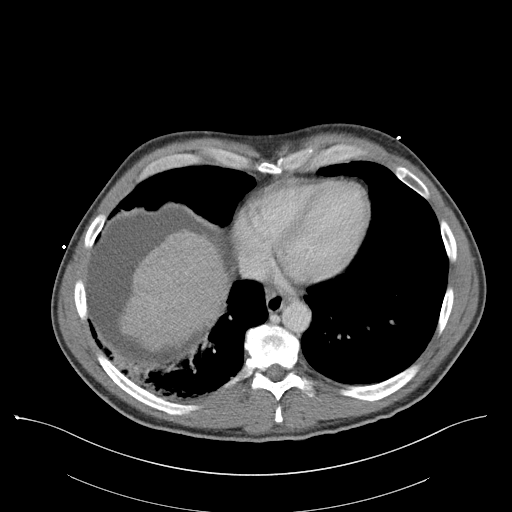
[im 94/100  soft-tissue]
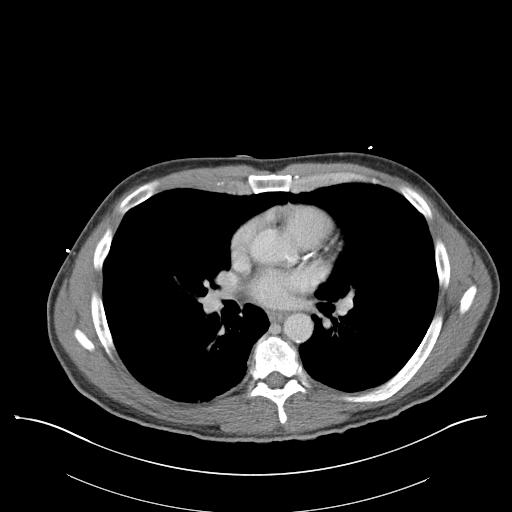

[Series 4: coronals · coronal · 0.71mm/px · 3 of 135 slices shown]
[im 45/135  soft-tissue]
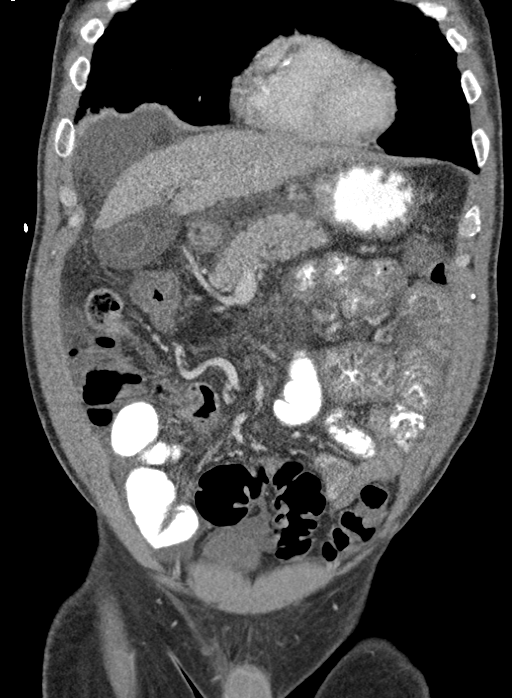
[im 60/135  soft-tissue]
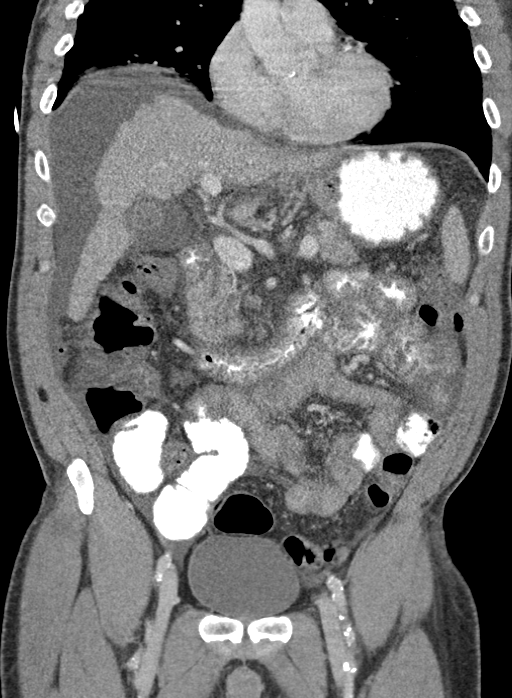
[im 75/135  soft-tissue]
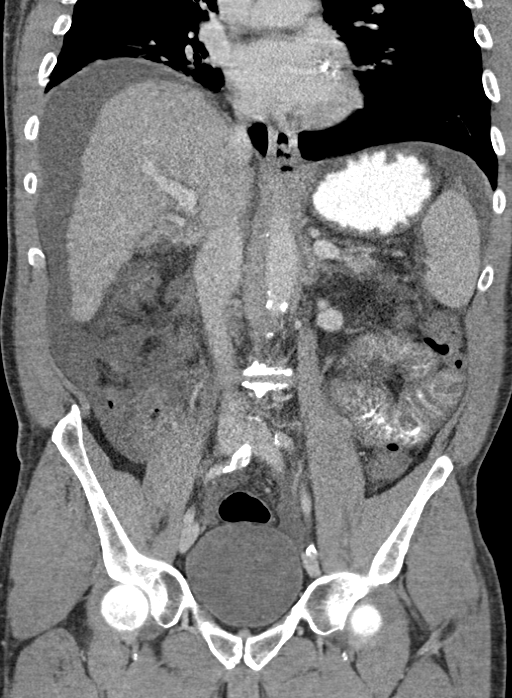

[15 of 46 positions shown; findings below may reference images not displayed]

FINDINGS: There is bilateral mild chronic interstitial disease, right greater
than left. There are esophageal varices noted. There is coronary
artery atherosclerosis involving the left main, LAD, circumflex and
right coronary artery.

The liver is diminutive in size with a micronodular contour more
consistent with cirrhosis. There is no intrahepatic or extrahepatic
biliary ductal dilatation. There is severe gallbladder wall
thickening. There is a small amount air within the gallbladder.
There is mild splenomegaly. The kidneys, adrenal glands and pancreas
are normal. The bladder is unremarkable.

There is no bowel dilatation to suggest obstruction. There is a
small amount of abdominal ascites. There is bowel wall thickening
involving the small bowel and colon as can be seen with a
hypoalbuminemic and cirrhotic state. There is no pneumoperitoneum,
pneumatosis, or portal venous gas. There is no abdominal or pelvic
free fluid. There is no lymphadenopathy.

The abdominal aorta is normal in caliber with atherosclerosis.

There are no lytic or sclerotic osseous lesions. There is
degenerative disc disease at L3-4, L4-5 and L5-S1.
IMPRESSION: 1. Cirrhotic liver.  No hyperenhancing mass to suggest a hepatoma.
2. Small amount of abdominal ascites with bowel wall thickening
involving the small bowel and colon as can be seen in the setting of
hypoalbuminemia and cirrhosis versus enterocolitis.
3. Gallbladder wall thickening likely related to hepatocellular
disease.
4. Small amount of air within the gallbladder which may be secondary
to recent instrumentation. In the absence of recent instrumentation
the appearance is concerning for infection.
5. Mild splenomegaly and esophageal varices.

## 2015-11-14 ENCOUNTER — Encounter: Payer: Self-pay | Admitting: Internal Medicine

## 2015-11-14 ENCOUNTER — Ambulatory Visit: Payer: BLUE CROSS/BLUE SHIELD | Attending: Internal Medicine | Admitting: Internal Medicine

## 2015-11-14 ENCOUNTER — Other Ambulatory Visit: Payer: Self-pay | Admitting: Internal Medicine

## 2015-11-14 VITALS — BP 120/70 | HR 64 | Temp 98.0°F | Resp 16 | Ht 71.0 in | Wt 254.4 lb

## 2015-11-14 DIAGNOSIS — F17210 Nicotine dependence, cigarettes, uncomplicated: Secondary | ICD-10-CM | POA: Diagnosis not present

## 2015-11-14 DIAGNOSIS — I8500 Esophageal varices without bleeding: Secondary | ICD-10-CM | POA: Diagnosis not present

## 2015-11-14 DIAGNOSIS — B1920 Unspecified viral hepatitis C without hepatic coma: Secondary | ICD-10-CM | POA: Diagnosis not present

## 2015-11-14 DIAGNOSIS — K7460 Unspecified cirrhosis of liver: Secondary | ICD-10-CM | POA: Insufficient documentation

## 2015-11-14 DIAGNOSIS — Z0000 Encounter for general adult medical examination without abnormal findings: Secondary | ICD-10-CM | POA: Diagnosis not present

## 2015-11-14 DIAGNOSIS — Z79899 Other long term (current) drug therapy: Secondary | ICD-10-CM | POA: Insufficient documentation

## 2015-11-14 DIAGNOSIS — C220 Liver cell carcinoma: Secondary | ICD-10-CM | POA: Diagnosis not present

## 2015-11-14 DIAGNOSIS — Z7682 Awaiting organ transplant status: Secondary | ICD-10-CM | POA: Insufficient documentation

## 2015-11-14 LAB — CBC WITH DIFFERENTIAL/PLATELET
BASOS ABS: 0 10*3/uL (ref 0.0–0.1)
Basophils Relative: 1 % (ref 0–1)
Eosinophils Absolute: 0.2 10*3/uL (ref 0.0–0.7)
Eosinophils Relative: 4 % (ref 0–5)
HEMATOCRIT: 34.9 % — AB (ref 39.0–52.0)
HEMOGLOBIN: 11.1 g/dL — AB (ref 13.0–17.0)
Lymphocytes Relative: 17 % (ref 12–46)
Lymphs Abs: 0.8 10*3/uL (ref 0.7–4.0)
MCH: 24.7 pg — ABNORMAL LOW (ref 26.0–34.0)
MCHC: 31.8 g/dL (ref 30.0–36.0)
MCV: 77.7 fL — AB (ref 78.0–100.0)
Monocytes Absolute: 0.7 10*3/uL (ref 0.1–1.0)
Monocytes Relative: 14 % — ABNORMAL HIGH (ref 3–12)
NEUTROS ABS: 3.1 10*3/uL (ref 1.7–7.7)
NEUTROS PCT: 64 % (ref 43–77)
Platelets: 93 10*3/uL — ABNORMAL LOW (ref 150–400)
RBC: 4.49 MIL/uL (ref 4.22–5.81)
RDW: 17.4 % — AB (ref 11.5–15.5)
WBC: 4.8 10*3/uL (ref 4.0–10.5)

## 2015-11-14 LAB — TSH: TSH: 2.67 u[IU]/mL (ref 0.350–4.500)

## 2015-11-14 LAB — BASIC METABOLIC PANEL
BUN: 10 mg/dL (ref 7–25)
CHLORIDE: 101 mmol/L (ref 98–110)
CO2: 26 mmol/L (ref 20–31)
Calcium: 9.3 mg/dL (ref 8.6–10.3)
Creat: 0.89 mg/dL (ref 0.70–1.25)
Glucose, Bld: 166 mg/dL — ABNORMAL HIGH (ref 65–99)
POTASSIUM: 4.1 mmol/L (ref 3.5–5.3)
SODIUM: 135 mmol/L (ref 135–146)

## 2015-11-14 LAB — LIPID PANEL
CHOLESTEROL: 128 mg/dL (ref 125–200)
HDL: 47 mg/dL (ref >=40)
LDL Cholesterol: 66 mg/dL (ref <130)
TRIGLYCERIDES: 73 mg/dL (ref <150)
Total CHOL/HDL Ratio: 2.7 Ratio (ref <=5.0)
VLDL: 15 mg/dL (ref <30)

## 2015-11-14 MED ORDER — OXYCODONE HCL 5 MG PO TABS: 5.0000 mg | ORAL_TABLET | Freq: Two times a day (BID) | ORAL | Status: DC | PRN

## 2015-11-14 NOTE — Progress Notes (Signed)
Patient here his general check up On transplant list for a liver Patient had flu vaccine at [facility]

## 2015-11-14 NOTE — Progress Notes (Signed)
Patient ID: George Fuller, male   DOB: 09-03-1956, 60 y.o.   MRN: [id number]  CC: physical  HPI: George Fuller is a 60 y.o. male here today for a physical exam.  Patient has past medical history of Hep C, hepatocellular carcinoma, esophageal varices. Patient is currently on liver transplant list at [facility]. Patient reports that he would like a check up just to make sure things are going ok with him. He is up to date on all immunizations and procedures. Last Colonoscopy and Endoscopy was 2016 which was normal. No signs of blood loss. He states that he continues to take lasix daily but has not had his potassium checked in several months. He is not currently on potassium replacement. He does admit to generalized joint pain in his shoulders, hips, and knees. He only takes oxycodone when he has severe pain.   No Known Allergies Past Medical History  Diagnosis Date  . Varices, esophageal ([place])   . Clostridium difficile infection   . Liver cirrhosis ([place])   . Hepatitis C   . HCC (hepatocellular carcinoma) ([place]) 02/12/2014  . Ascites    Current Outpatient Prescriptions on File Prior to Visit  Medication Sig Dispense Refill  . furosemide (LASIX) 40 MG tablet TAKE 1 TABLET BY MOUTH EVERY DAY 30 tablet 3  . oxyCODONE (OXY IR/ROXICODONE) 5 MG immediate release tablet Take 1 tablet (5 mg total) by mouth 2 (two) times daily as needed for severe pain. 30 tablet 0  . propranolol (INDERAL) 20 MG tablet Take 1 tablet (20 mg total) by mouth 2 (two) times daily. 60 tablet 3  . traZODone (DESYREL) 50 MG tablet Take 1 tablet (50 mg total) by mouth at bedtime as needed. for sleep 30 tablet 3  . ciprofloxacin (CIPRO) 500 MG tablet Take 1 tablet (500 mg total) by mouth 2 (two) times daily. (Patient not taking: Reported on 04/11/2015) 20 tablet 0  . furosemide (LASIX) 40 MG tablet Take 1 tablet (40 mg total) by mouth daily. (Patient not taking: Reported on 04/11/2015) 30 tablet 4  . hydrocortisone (ANUSOL-HC) 2.5 %  rectal cream Place 1 application rectally 2 (two) times daily. 30 g 2  . zoster vaccine live, PF, (ZOSTAVAX) 91478 UNT/0.65ML injection Inject 19,400 Units into the skin once. (Patient not taking: Reported on 04/11/2015) 1 each 0   No current facility-administered medications on file prior to visit.   Family History  Problem Relation Age of Onset  . COPD Mother   . Alcohol abuse Father    Social History   Social History  . Marital Status: Married    Spouse Name: N/A  . Number of Children: N/A  . Years of Education: N/A   Occupational History  . Not on file.   Social History Main Topics  . Smoking status: Smoker, Current Status Unknown -- 1.00 packs/day for 30 years    Types: Cigarettes  . Smokeless tobacco: Not on file     Comment: Patient states last cig 04/11/2014  . Alcohol Use: No  . Drug Use: No  . Sexual Activity: Not on file   Other Topics Concern  . Not on file   Social History Narrative    Review of Systems: Constitutional: Negative for fever, chills, diaphoresis, activity change, appetite change and fatigue. HENT: Negative for ear pain, nosebleeds, congestion, facial swelling, rhinorrhea, neck pain, neck stiffness and ear discharge.  Eyes: Negative for pain, discharge, redness, itching and visual disturbance. Respiratory: Negative for cough, choking, chest tightness, shortness of  breath, wheezing and stridor.  Cardiovascular: Negative for chest pain, palpitations and leg swelling. Gastrointestinal: Negative for abdominal distention. Genitourinary: Negative for dysuria, urgency, frequency, hematuria, flank pain, decreased urine volume, difficulty urinating and dyspareunia.  Musculoskeletal: Negative for back pain and gait problem. + joint pain Neurological: Negative for dizziness, tremors, seizures, syncope, facial asymmetry, speech difficulty, weakness, light-headedness, numbness and headaches.  Hematological: Negative for adenopathy. Does not bruise/bleed  easily. Psychiatric/Behavioral: Negative for hallucinations, behavioral problems, confusion, dysphoric mood, decreased concentration and agitation.    Objective:   Filed Vitals:   11/14/15 1450  BP: 120/70  Pulse: 64  Temp: 98 F (36.7 C)  Resp: 16    Physical Exam: Constitutional: Patient appears well-developed and well-nourished. No distress. HENT: Normocephalic, atraumatic, External right and left ear normal. Oropharynx is clear and moist.  Eyes: Conjunctivae and EOM are normal. PERRLA, no scleral icterus. Neck: Normal ROM. Neck supple. No JVD. No tracheal deviation. No thyromegaly. CVS: RRR, S1/S2 +, no murmurs, no gallops, no carotid bruit.  Pulmonary: Effort and breath sounds normal, no stridor, rhonchi, wheezes, rales.  Abdominal: Soft. BS +,  no distension, tenderness, rebound or guarding.  Musculoskeletal: Normal range of motion. No edema and no tenderness.  Lymphadenopathy: No lymphadenopathy noted, cervical, inguinal or axillary Neuro: Alert. Normal reflexes, muscle tone coordination. No cranial nerve deficit. Skin: Skin is warm and dry. No rash noted. Not diaphoretic. No erythema. No pallor. Psychiatric: Normal mood and affect. Behavior, judgment, thought content normal. GU: Patient refused prostate exam today  Lab Results  Component Value Date   WBC 3.9* 12/24/2014   HGB 10.3* 12/24/2014   HCT 31.5* 12/24/2014   MCV 104.3* 12/24/2014   PLT 58* 12/24/2014   Lab Results  Component Value Date   CREATININE 1.06 04/11/2015   BUN 12 04/11/2015   NA 137 04/11/2015   K 4.6 04/11/2015   CL 103 04/11/2015   CO2 26 04/11/2015    No results found for: HGBA1C Lipid Panel     Component Value Date/Time   CHOL 113 12/24/2014 0932   TRIG 67 12/24/2014 0932   HDL 63 12/24/2014 0932   CHOLHDL 1.8 12/24/2014 0932   VLDL 13 12/24/2014 0932   LDLCALC 37 12/24/2014 0932       Assessment and plan:   George Fuller was seen today for follow-up.  Diagnoses and all orders  for this visit:  Annual physical exam -     Basic Metabolic Panel -     CBC with Differential -     TSH -     Vitamin D, 25-hydroxy -     Lipid panel I will check his potassium today. Will send potassium replacement if needed. So far his potassium level has remained WNL even with lasix use.  HCC (hepatocellular carcinoma) ([place]) -     Refilled oxyCODONE (OXY IR/ROXICODONE) 5 MG immediate release tablet; Take 1 tablet (5 mg total) by mouth 2 (two) times daily as needed for severe pain.   Return in about 3 months (around 02/12/2016) for Lab Visit-bmet.       Lance Bosch, [facility] [phone] 11/14/2015, 2:59 PM

## 2015-11-15 ENCOUNTER — Telehealth: Payer: Self-pay

## 2015-11-15 LAB — HEMOGLOBIN A1C
Hgb A1c MFr Bld: 7.3 % — ABNORMAL HIGH (ref <5.7)
Mean Plasma Glucose: 163 mg/dL — ABNORMAL HIGH (ref <117)

## 2015-11-15 LAB — VITAMIN D 25 HYDROXY (VIT D DEFICIENCY, FRACTURES): VIT D 25 HYDROXY: 22 ng/mL — AB (ref 30–100)

## 2015-11-15 MED ORDER — METFORMIN HCL ER 500 MG PO TB24: 500.0000 mg | ORAL_TABLET | Freq: Every day | ORAL | Status: DC

## 2015-11-15 NOTE — Telephone Encounter (Signed)
-----   Message from Lance Bosch, NP sent at 11/15/2015  7:21 AM EST ----- Langley Gauss, call the lab and see if they can run a hemoglobin a1c off his blood samples. ---His sugar was really elevated for him to be fasting.---If not have him to come back as a nurse visit to check a A1C Vitamin D is low, get OTC 800 IU to take daily Some anemia---want him to take a daily iron supplement. I will send to pharmacy or he can get it OTC (same dose) Cholesterol looks great!!!

## 2015-11-15 NOTE — Telephone Encounter (Signed)
-----   Message from Lance Bosch, NP sent at 11/15/2015  4:56 PM EST ----- Patient came back diabetic. This is likely contributed to his weight change. I want him to start on Metformin XR 500 mg once per day. He will need reassessment in 3 months. This may cause some slight diarrhea for the first few days

## 2015-11-15 NOTE — Telephone Encounter (Signed)
Spoke with patient this am and he is aware of his lab results Patient did state he had a cup of coffee with cream and sugar that morning and  Could explain why his sugar was elevated Patient is aware to take OTC vitamin D supplement and iron supplements

## 2015-11-15 NOTE — Telephone Encounter (Signed)
Called and spoke with patient Patient is aware his A1C shows he is a diabetic And to pick up his medication at the pharmacy on file

## 2015-12-12 ENCOUNTER — Telehealth: Payer: Self-pay | Admitting: Internal Medicine

## 2015-12-12 NOTE — Telephone Encounter (Signed)
Patient called requesting a medication refill for oxycodone. Patient also is requesting a meter to check his blood sugar. Please follow up with patient.

## 2015-12-13 ENCOUNTER — Telehealth: Payer: Self-pay

## 2015-12-13 ENCOUNTER — Other Ambulatory Visit: Payer: Self-pay | Admitting: Internal Medicine

## 2015-12-13 DIAGNOSIS — C220 Liver cell carcinoma: Secondary | ICD-10-CM

## 2015-12-13 MED ORDER — OXYCODONE HCL 5 MG PO TABS: 5.0000 mg | ORAL_TABLET | Freq: Two times a day (BID) | ORAL | Status: DC | PRN

## 2015-12-13 NOTE — Telephone Encounter (Signed)
Tried to contact patient to let him know his RX for oxycodone  Was ready for pick up Patient not available Message left on voice mail to return our call

## 2015-12-13 NOTE — Telephone Encounter (Signed)
Pt. Was told that his Rx was ready to be picked up.

## 2015-12-14 ENCOUNTER — Other Ambulatory Visit: Payer: Self-pay | Admitting: Internal Medicine

## 2015-12-14 NOTE — Telephone Encounter (Signed)
Pt. Came into facility to pick up a Rx and he sated he stated taking metformin so he needs a Rx for a meter and test strips. Please f/u with pt.

## 2015-12-15 MED ORDER — TRUEPLUS LANCETS 28G MISC: Status: AC

## 2015-12-15 MED ORDER — GLUCOSE BLOOD VI STRP: ORAL_STRIP | Status: AC

## 2015-12-15 MED ORDER — TRUE METRIX METER W/DEVICE KIT: PACK | Status: AC

## 2015-12-15 NOTE — Addendum Note (Signed)
Addended by: Dorothe Pea on: 12/15/2015 10:09 AM   Modules accepted: Orders

## 2015-12-19 NOTE — Telephone Encounter (Signed)
Denise please refill

## 2016-01-16 ENCOUNTER — Telehealth: Payer: Self-pay | Admitting: Internal Medicine

## 2016-01-16 NOTE — Telephone Encounter (Signed)
Patient called and requested a med refill for traZODone (DESYREL) 50 MG tablet and oxyCODONE (OXY IR/ROXICODONE) 5 MG immediate release tablet. Please f/u with pt.

## 2016-01-20 NOTE — Telephone Encounter (Signed)
Patient called and requested a med refill for Trazodone 50mg  and Oxycodone 5mg . Please f/u with pt.

## 2016-01-23 NOTE — Telephone Encounter (Signed)
Patient is calling to follow up with medication refill request...please follow up

## 2016-01-23 NOTE — Telephone Encounter (Signed)
Pt. Called requesting a med refill on the following medications:  traZODone (DESYREL) 50 MG tablet  oxyCODONE (OXY IR/ROXICODONE) 5 MG immediate release tablet   Please f/u with pt.

## 2016-01-24 ENCOUNTER — Telehealth: Payer: Self-pay

## 2016-01-24 DIAGNOSIS — C220 Liver cell carcinoma: Secondary | ICD-10-CM

## 2016-01-24 MED ORDER — TRAZODONE HCL 50 MG PO TABS: 50.0000 mg | ORAL_TABLET | Freq: Every evening | ORAL | Status: AC | PRN

## 2016-01-24 MED ORDER — OXYCODONE HCL 5 MG PO TABS: 5.0000 mg | ORAL_TABLET | Freq: Two times a day (BID) | ORAL | Status: DC | PRN

## 2016-01-24 NOTE — Telephone Encounter (Signed)
Spoke with patient, patient verified name and DOB. Patient was informed that medication will be refilled and available for pickup on his oxycodone once approved by Provider. Patient Trazodone will be sent over to the Pharmacy on file. Patient verbalized he understood with no further questions.

## 2016-01-25 NOTE — Telephone Encounter (Signed)
Spoke with patient, patient verified name and DOB. Informed patient that request for refills are available for pickup. Patient verbalized he understood with no further questions.

## 2016-02-20 ENCOUNTER — Telehealth: Payer: Self-pay | Admitting: Internal Medicine

## 2016-02-20 NOTE — Telephone Encounter (Signed)
Patient is requesting medication refill for his pain medication (oxycodone)....please follow up with patient

## 2016-02-27 NOTE — Telephone Encounter (Signed)
Pt. Called requesting a refill on oxyCODONE (OXY IR/ROXICODONE) 5 MG immediate release tablet. Please f/u with pt.

## 2016-02-28 ENCOUNTER — Other Ambulatory Visit: Payer: Self-pay | Admitting: *Deleted

## 2016-02-28 DIAGNOSIS — C220 Liver cell carcinoma: Secondary | ICD-10-CM

## 2016-02-28 NOTE — Telephone Encounter (Signed)
Patient is requesting Oxycodone refill.

## 2016-02-29 MED ORDER — OXYCODONE HCL 5 MG PO TABS: 5.0000 mg | ORAL_TABLET | Freq: Two times a day (BID) | ORAL | Status: AC | PRN

## 2016-02-29 NOTE — Telephone Encounter (Signed)
Patient is following up on request for Oxycodone refill.  This is his third time calling and he is completely out  Please follow up with patient

## 2016-03-01 NOTE — Telephone Encounter (Signed)
Medical Assistant left message on patient's home and cell voicemail. Voicemail states to give a call back to [place] with [facility] at [phone].  !!!Please inform patient of prescription being placed at the front for pickup!!!

## 2016-03-02 ENCOUNTER — Telehealth: Payer: Self-pay | Admitting: Internal Medicine

## 2016-03-02 NOTE — Telephone Encounter (Signed)
Pt. Came into the facility stating that he was prescribed Metformin and was never giving a meter to check his blood sugar. Pt. Is running low on metformin And would like a refill. He stated that he was out side one day and was sweating anfelt like he was going to pass out. He does not know if he has DM and  Would like to speak with the nurse. Please f/u with pt.

## 2016-03-06 ENCOUNTER — Other Ambulatory Visit: Payer: Self-pay | Admitting: *Deleted

## 2016-03-06 DIAGNOSIS — C220 Liver cell carcinoma: Secondary | ICD-10-CM

## 2016-03-06 NOTE — Telephone Encounter (Signed)
Refill was placed on 02/29/16

## 2016-03-06 NOTE — Telephone Encounter (Signed)
Patient was last seen 11/14/15. Patient request was filled on 02/29/16

## 2016-04-05 ENCOUNTER — Telehealth: Payer: Self-pay | Admitting: *Deleted

## 2016-04-05 DIAGNOSIS — E119 Type 2 diabetes mellitus without complications: Secondary | ICD-10-CM

## 2016-04-05 MED ORDER — METFORMIN HCL ER 500 MG PO TB24: 500.0000 mg | ORAL_TABLET | Freq: Every day | ORAL | Status: AC

## 2016-04-05 NOTE — Telephone Encounter (Signed)
Patients metformin has been refilled. Patient needs to schedule an appointment and establish with a new PCP. Patient verified DOB Patient had a liver transplant last month and was taken off of metformin. Patient is to check his sugar levels 3 times a day and if the level goes above 150 he is instructed to give himself insulin. MA advised patient to allow refill to be restocked and wait until his 6 week checkup from the transplant. No further questions at this time.

## 2017-04-15 MED ORDER — ENTECAVIR 0.5 MG TABLET
ORAL_TABLET | Freq: Every day | ORAL | 11 refills | 0 days | Status: CP
Start: 2017-04-15 — End: 2017-04-18

## 2017-04-15 MED FILL — PROGRAF/1MG/CAP: PROGRAF/1MG/CAP | 30 days supply | Qty: 150 | Fill #0

## 2017-04-16 MED FILL — ENTECAVIR/0.5MG/TABS: ENTECAVIR/0.5MG/TABS | 30 days supply | Qty: 30 | Fill #0

## 2017-04-18 MED ORDER — ENTECAVIR 0.5 MG TABLET
ORAL_TABLET | Freq: Every day | ORAL | 3 refills | 0 days | Status: CP
Start: 2017-04-18 — End: 2018-04-18

## 2017-04-23 MED ORDER — LIDOCAINE HCL 2 % MUCOSAL SOLUTION
OROMUCOSAL | 1 refills | 0 days | Status: CP | PRN
Start: 2017-04-23 — End: 2017-05-23

## 2017-04-30 ENCOUNTER — Ambulatory Visit
Admission: RE | Admit: 2017-04-30 | Discharge: 2017-04-30 | Disposition: A | Discharge status: Home / Self Care | Payer: MEDICARE | Attending: Hematology & Oncology | Admitting: Hematology & Oncology

## 2017-04-30 DIAGNOSIS — C3490 Malignant neoplasm of unspecified part of unspecified bronchus or lung: Secondary | ICD-10-CM
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
## 2017-04-30 NOTE — Unmapped (Signed)
STRATA ordered per Dr. Eloise Harman page/inbasket.   ??  Histology will cut slides from 2018 biopsy for [facility]. Once report is generated it will be put in pts Labs tab in their EMR.

## 2017-04-30 NOTE — Unmapped (Signed)
Radiation Oncology Completion Note    Patient Name: Michael Barrett  Patient Age: 61 y.o.  Encounter Date: 04/12/2017    Referring Physician:   No referring provider defined for this encounter.    Primary Care Provider:  Aida Puffer, MD    Diagnoses:  Metastatic lung cancer, mixed small cell and squamous histology, with T10 vertebral metastasis with narrowing of the spinal canal without cord compression    Treatment Intent: palliative    Tolerance to Treatment:  No toxicities or complications    Radiation Treatment Summary:    Treatment site Treatmenttechnique/Modality Beam Energy Dose per fraction Total number  of fractions Total dose Start  date End date   T9-T11 3D CRT 15 X 300 cGy 10 3000 cGy 04/01/2017   04/12/2017       Concurrent Chemotherapy: No    Plan for Follow-Up: [name] is to return for follow up with Dr. Janann August in 2 weeks, and follow up with Dr. Wyline Mood as needed.    Electronically signed by:  Mikael Spray, MD  Radiation Oncology, PGY-3
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
## 2017-04-30 NOTE — Unmapped (Addendum)
It was a pleasure to see you in clinic today.  - As we discussed you have Stage IV or metastatic lung cancer.  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  The small cell is the more aggressive type so we choose the chemotherapy that especially targets this but it also will treat the non-small cancer component  - We will do 2 cycles of chemotherapy and repeat the scans.  If we get a response, we continue for another 2 cycles.  - We will also give you an injection called Denosumab  to help support your bones.  - Please take calcium plus vitamin D daily to support your bones    Please call if you have any new symptoms or concerns and we will see you sooner.      Please call our nurse navigator if you have any interval questions or concerns:  Nurse Navigator: Leisa Lenz, RN:  [email or web address]  Nurse Triage Line: [phone]  ??  For emergencies, evenings or weekends, please call [phone] and ask for oncology fellow on call.  ??  For appointments, please call [phone].  For [facility] pharmacy, please call [phone].       carboplatin  Pronunciation:  KAR boe PLA tin  Brand:  Paraplatin  What is the most important information I should know about carboplatin?  Carboplatin is a cancer medication used in chemotherapy combinations to treat ovarian cancer.  You should not receive carboplatin if you have severe bleeding or bone marrow suppression.  Carboplatin can harm your kidneys, and this effect is increased when you also use certain other medicines harmful to the kidneys. Before you receive carboplatin, tell your doctor about all other medications you use. Many other drugs (including some over-the-counter medicines) can be harmful to the kidneys.  Carboplatin can lower blood cells that help your body fight infections and help your blood to clot. You may get an infection or bleed more easily. Call your doctor if you have unusual bruising or bleeding, or signs of infection (fever, chills, body aches).  What is carboplatin?  Carboplatin is a cancer medication that interferes with the growth of cancer cells and slows their growth and spread in the body.  Carboplatin is used together with other cancer medications to treat ovarian cancer.  Carboplatin may also be used for purposes not listed in this medication guide.  What should I discuss with my healthcare provider before receiving carboplatin?  You should not receive this medication if you are allergic to carboplatin or similar medications such as oxaliplatin (Eloxatin) or cisplatin (Platinol). You should not receive carboplatin if you have severe bleeding or bone marrow suppression.  To make sure carboplatin is safe for you, tell your doctor if you have:  ?? liver disease;  ?? kidney disease;  ?? a weak immune system; or  ?? if you have received carboplatin in the past.  Do not use carboplatin if you are pregnant. It could harm the unborn baby. Use effective birth control, and tell your doctor if you become pregnant during treatment.  It is not known whether carboplatin passes into breast milk or if it could harm a nursing baby. You should not breast-feed while being treated with carboplatin.  How is carboplatin given?  Carboplatin is injected into a vein through an IV. You will receive this injection in a clinic or hospital setting.  Carboplatin is usually given once every 4 weeks. Follow your doctor's instructions.  You may be given other medications  to prevent nausea or vomiting while you are receiving carboplatin.  Carboplatin can lower blood cells that help your body fight infections and help your blood to clot. This can make it easier for you to bleed from an injury or get sick from being around others who are ill. Your blood may need to be tested often. Your kidney and liver function may also need to be tested.  You may need to receive blood transfusions while you are being treated with carboplatin.  What happens if I miss a dose?  Call your doctor if you miss an appointment for your carboplatin injection.  What happens if I overdose?  Seek emergency medical attention or call the Poison Help line at [phone].  What should I avoid while using carboplatin?  Avoid being near people who are sick or have infections. Tell your doctor at once if you develop signs of infection.  Carboplatin can cause side effects that may impair your vision. Be careful if you drive or do anything that requires you to be able to see clearly.  This medicine can pass into body fluids (urine, feces, vomit). For at least 48 hours after you receive a dose, avoid allowing your body fluids to come into contact with your hands or other surfaces. Caregivers should wear rubber gloves while cleaning up a patient's body fluids, handling contaminated trash or laundry or changing diapers. Wash hands before and after removing gloves. Wash soiled clothing and linens separately from other laundry.  What are the possible side effects of carboplatin?  Get emergency medical help if you have signs of an allergic reaction:  hives; difficulty breathing; swelling of your face, lips, tongue, or throat.  Call your doctor at once if you have:  ?? pale skin, feeling light-headed or short of breath, rapid heart rate, trouble concentrating;  ?? easy bruising, unusual bleeding (nose, mouth, vagina, or rectum), purple or red pinpoint spots under your skin;  ?? fever, chills, body aches, flu symptoms, sores in your mouth and throat;  ?? severe or ongoing vomiting;  ?? stomach pain, dark urine, clay-colored stools, jaundice (yellowing of the skin or eyes);  ?? numbness or tingly feeling in your hands or feet;  ?? hearing or vision problems;  ?? skin changes where the medicine was injected; or  ?? low magnesium (confusion, uneven heart rate, jerking muscle movements, muscle weakness or limp feeling).  Common side effects may include:  ?? nausea, vomiting, loss of appetite;  ?? tired feeling;  ?? temporary hair loss; or  ?? pain, swelling or redness where the medicine was injected.  This is not a complete list of side effects and others may occur. Call your doctor for medical advice about side effects. You may report side effects to FDA at 1-800-FDA-1088.  What other drugs will affect carboplatin?  Carboplatin can harm your kidneys. This effect is increased when you also use certain other medicines, including: antivirals, chemotherapy, injected antibiotics, medicine for bowel disorders, medicine to prevent organ transplant rejection, and some pain or arthritis medicines (including aspirin, Tylenol, Advil, and Aleve).  This list is not complete. Other drugs may interact with carboplatin, including prescription and over-the-counter medicines, vitamins, and herbal products. Not all possible interactions are listed in this medication guide.  Where can I get more information?  Your doctor or pharmacist can provide more information about carboplatin.    Remember, keep this and all other medicines out of the reach of children, never share your medicines with others, and use this  medication only for the indication prescribed.  Every effort has been made to ensure that the information provided by [employer] ('[employer]') is accurate, up-to-date, and complete, but no guarantee is made to that effect. Drug information contained herein may be time sensitive. [employer] information has been compiled for use by healthcare practitioners and consumers in the [place] and therefore [employer] does not warrant that uses outside of the [place] are appropriate, unless specifically indicated otherwise. [employer]'s drug information does not endorse drugs, diagnose patients or recommend therapy. [employer]'s drug information is an [employer], corporate to assist licensed healthcare practitioners in caring for their patients and/or to serve consumers viewing this service as a supplement to, and not a substitute for, the expertise, skill, knowledge and judgment of healthcare practitioners. The absence of a warning for a given drug or drug combination in no way should be construed to indicate that the drug or drug combination is safe, effective or appropriate for any given patient. [employer] does not assume any responsibility for any aspect of healthcare administered with the aid of information [employer] provides. The information contained herein is not intended to cover all possible uses, directions, precautions, warnings, drug interactions, allergic reactions, or adverse effects. If you have questions about the drugs you are taking, check with your doctor, nurse or pharmacist.  Copyright [id number] [employer] Version: 6.04. Revision date: 07/06/2014.  Care instructions adapted under license by [facility]. If you have questions about a medical condition or this instruction, always ask your healthcare professional. [employer] disclaims any warranty or liability for your use of this information.         etoposide (injection)  Pronunciation:  ee TOE poe side  Brand:  Toposar  What is the most important information I should know about etoposide?  Etoposide can lower blood cells that help your body fight infections and help your blood to clot. You may get an infection or bleed more easily. Call your doctor if you have unusual bruising or bleeding, or signs of infection (fever, chills, body aches).  What is etoposide?  Etoposide is a cancer medicine that interferes with the growth and spread of cancer cells in the body.  Etoposide is used to treat cancer of the lung or testicles. It is usually given with other cancer medicines in a combination chemotherapy.  Etoposide may also be used for purposes not listed in this medication guide.  What should I discuss with my healthcare provider before receiving etoposide?  You should not use etoposide if you are allergic to it.  To make sure etoposide is safe for you, tell your doctor if you have kidney disease.  Using etoposide may increase your risk of developing other types of cancer, such as leukemia. Talk with your doctor about your specific risk.  Using etoposide during pregnancy could harm the unborn baby. Tell your doctor if you are pregnant. Use effective birth control while you are receiving this medicine  It is not known whether etoposide passes into breast milk or if it could harm a nursing baby. You should not breast-feed while you are using etoposide.  How is etoposide given?  Etoposide is injected into a vein through an IV. A healthcare provider will give you this injection.  Etoposide is usually given as part of a 4-day or 5-day treatment cycle every 3 or 4 weeks. Follow your doctor's dosing instructions very carefully.  Etoposide can be harmful if it gets on your skin. If skin contact occurs,  wash the area with soap and water.  Etoposide can lower blood cells that help your body fight infections and help your blood to clot. Your blood will need to be tested often. Your cancer treatments may be delayed based on the results of these tests.  What happens if I miss a dose?  Call your doctor for instructions if you miss an appointment for your etoposide injection.  What happens if I overdose?  Seek emergency medical attention or call the Poison Help line at [phone].  What should I avoid while receiving etoposide?  Do not receive a live vaccine while using etoposide. The vaccine may not work as well during this time, and may not fully protect you from disease. Live vaccines include measles, mumps, rubella (MMR), rotavirus, typhoid, yellow fever, varicella (chickenpox), zoster (shingles), and nasal flu (influenza) vaccine.  Avoid being near people who are sick or have infections. Tell your doctor at once if you develop signs of infection.  Avoid activities that may increase your risk of bleeding or injury. Use extra care to prevent bleeding while shaving or brushing your teeth.  This medicine can pass into body fluids (urine, feces, vomit). For at least 48 hours after you receive a dose, avoid allowing your body fluids to come into contact with your hands or other surfaces. Caregivers should wear rubber gloves while cleaning up a patient's body fluids, handling contaminated trash or laundry or changing diapers. Wash hands before and after removing gloves. Wash soiled clothing and linens separately from other laundry.  What are the possible side effects of etoposide?  Get emergency medical help if you have signs of an allergic reaction: fever, chills, sweating, fast heartbeats, fainting; hives; difficult breathing; swelling of your face, lips, tongue, or throat.  Call your doctor at once if you have:  ?? fever, chills, body aches, flu symptoms, sores in your mouth and throat;  ?? easy bruising, unusual bleeding (nose, mouth, vagina, or rectum), purple or red pinpoint spots under your skin;  ?? pale skin, feeling light-headed or short of breath, rapid heart rate, trouble concentrating;  ?? upper stomach pain, itching, loss of appetite, dark urine, clay-colored stools, jaundice (yellowing of the skin or eyes);  ?? vision problems;  ?? a seizure (convulsions);  ?? sudden chest pain or discomfort, wheezing, dry cough or hack; or  ?? severe skin reaction fever, sore throat, swelling in your face or tongue, burning in your eyes, skin pain, followed by a red or purple skin rash that spreads (especially in the face or upper body) and causes blistering and peeling.  Common side effects may include:  ?? nausea, vomiting, stomach pain, loss of appetite;  ?? diarrhea, constipation;  ?? weakness;  ?? temporary hair loss;  ?? unusual or unpleasant taste in your mouth.  This is not a complete list of side effects and others may occur. Call your doctor for medical advice about side effects. You may report side effects to FDA at 1-800-FDA-1088.  What other drugs will affect etoposide?  Tell your doctor about all medicines you use, and those you start or stop using during your treatment with etoposide, especially:  ?? cyclosporine (Gengraf, Neoral, Sandimmune);  ?? a blood thinner --warfarin, Coumadin, Jantoven; or  ?? seizure medicine --carbamazepine, phenobarbital, phenytoin, valproic acid, and others.  This list is not complete. Other drugs may interact with etoposide, including prescription and over-the-counter medicines, vitamins, and herbal products. Not all possible interactions are listed in this medication guide.  Where can I  get more information?  Your doctor or pharmacist can provide more information about etoposide.    Remember, keep this and all other medicines out of the reach of children, never share your medicines with others, and use this medication only for the indication prescribed.  Every effort has been made to ensure that the information provided by [employer]') is accurate, up-to-date, and complete, but no guarantee is made to that effect. Drug information contained herein may be time sensitive. [employer] information has been compiled for use by healthcare practitioners and consumers in the [place] and therefore [employer] does not warrant that uses outside of the [place] are appropriate, unless specifically indicated otherwise. [employer]'s drug information does not endorse drugs, diagnose patients or recommend therapy. [employer]'s drug information is an [employer], corporate to assist licensed healthcare practitioners in caring for their patients and/or to serve consumers viewing this service as a supplement to, and not a substitute for, the expertise, skill, knowledge and judgment of healthcare practitioners. The absence of a warning for a given drug or drug combination in no way should be construed to indicate that the drug or drug combination is safe, effective or appropriate for any given patient. [employer] does not assume any responsibility for any aspect of healthcare administered with the aid of information [employer] provides. The information contained herein is not intended to cover all possible uses, directions, precautions, warnings, drug interactions, allergic reactions, or adverse effects. If you have questions about the drugs you are taking, check with your doctor, nurse or pharmacist.  Copyright [phone] [employer] Version: 2.06. Revision date: 10/27/2014.  Care instructions adapted under license by [facility]. If you have questions about a medical condition or this instruction, always ask your healthcare professional. [employer] disclaims any warranty or liability for your use of this information.

## 2017-05-06 LAB — CBC W/ DIFFERENTIAL
BASOPHILS ABSOLUTE COUNT: 0 10*3/uL (ref 0.0–0.2)
BASOPHILS RELATIVE PERCENT: 0 %
EOSINOPHILS ABSOLUTE COUNT: 0.1 10*3/uL (ref 0.0–0.4)
EOSINOPHILS RELATIVE PERCENT: 2 %
HEMATOCRIT: 34.2 % — ABNORMAL LOW (ref 37.5–51.0)
HEMOGLOBIN: 12 g/dL — ABNORMAL LOW (ref 13.0–17.7)
LYMPHOCYTES RELATIVE PERCENT: 19 %
MEAN CORPUSCULAR HEMOGLOBIN: 30.1 pg (ref 26.6–33.0)
MEAN CORPUSCULAR VOLUME: 86 fL (ref 79–97)
MONOCYTES ABSOLUTE COUNT: 0.7 10*3/uL (ref 0.1–0.9)
MONOCYTES RELATIVE PERCENT: 11 %
NEUTROPHILS ABSOLUTE COUNT: 4.2 10*3/uL (ref 1.4–7.0)
PLATELET COUNT: 155 10*3/uL (ref 150–379)
RED BLOOD CELL COUNT: 3.99 x10E6/uL — ABNORMAL LOW (ref 4.14–5.80)
RED CELL DISTRIBUTION WIDTH: 15.1 % (ref 12.3–15.4)
WHITE BLOOD CELL COUNT: 6.2 10*3/uL (ref 3.4–10.8)

## 2017-05-06 LAB — COMPREHENSIVE METABOLIC PANEL
A/G RATIO: 1.6 (ref 1.2–2.2)
ALBUMIN: 4.2 g/dL (ref 3.6–4.8)
ALKALINE PHOSPHATASE: 118 IU/L — ABNORMAL HIGH (ref 39–117)
ALT (SGPT): 16 IU/L (ref 0–44)
AST (SGOT): 18 IU/L (ref 0–40)
BLOOD UREA NITROGEN: 20 mg/dL (ref 8–27)
BUN / CREAT RATIO: 17 (ref 10–24)
CALCIUM: 9.6 mg/dL (ref 8.6–10.2)
CHLORIDE: 98 mmol/L (ref 96–106)
CO2: 22 mmol/L (ref 20–29)
CREATININE: 1.17 mg/dL (ref 0.76–1.27)
GLUCOSE: 114 mg/dL — ABNORMAL HIGH (ref 65–99)
POTASSIUM: 5.1 mmol/L (ref 3.5–5.2)
SODIUM: 132 mmol/L — ABNORMAL LOW (ref 134–144)
TOTAL PROTEIN: 6.9 g/dL (ref 6.0–8.5)

## 2017-05-06 LAB — BILIRUBIN DIRECT: Lab: 0.15

## 2017-05-06 LAB — PHOSPHORUS, SERUM: Lab: 3.3

## 2017-05-06 LAB — MAGNESIUM: Lab: 1.9

## 2017-05-06 LAB — A/G RATIO: Lab: 1.6

## 2017-05-06 LAB — HEMOGLOBIN: Lab: 12 — ABNORMAL LOW

## 2017-05-06 NOTE — Unmapped (Signed)
Oncology Nurse Nav note    [employer] CDX completed and faxed to send out lab per Dr. Janann August

## 2017-05-07 ENCOUNTER — Ambulatory Visit: Admission: RE | Admit: 2017-05-07 | Discharge: 2017-05-07 | Disposition: A | Discharge status: Home / Self Care

## 2017-05-07 ENCOUNTER — Ambulatory Visit
Admission: RE | Admit: 2017-05-07 | Discharge: 2017-05-07 | Disposition: A | Discharge status: Home / Self Care | Payer: MEDICARE

## 2017-05-07 ENCOUNTER — Ambulatory Visit
Admission: RE | Admit: 2017-05-07 | Discharge: 2017-05-07 | Disposition: A | Discharge status: Home / Self Care | Payer: MEDICARE | Attending: Hematology & Oncology | Admitting: Hematology & Oncology

## 2017-05-07 DIAGNOSIS — C3431 Malignant neoplasm of lower lobe, right bronchus or lung: Principal | ICD-10-CM

## 2017-05-07 DIAGNOSIS — C228 Malignant neoplasm of liver, primary, unspecified as to type: Principal | ICD-10-CM

## 2017-05-07 DIAGNOSIS — Z944 Liver transplant status: Secondary | ICD-10-CM

## 2017-05-07 DIAGNOSIS — F4323 Adjustment disorder with mixed anxiety and depressed mood: Principal | ICD-10-CM

## 2017-05-07 LAB — CBC W/ AUTO DIFF
BASOPHILS ABSOLUTE COUNT: 0 10*9/L (ref 0.0–0.1)
EOSINOPHILS ABSOLUTE COUNT: 0.1 10*9/L (ref 0.0–0.4)
HEMATOCRIT: 33.1 % — ABNORMAL LOW (ref 41.0–53.0)
LARGE UNSTAINED CELLS: 1 % (ref 0–4)
LYMPHOCYTES ABSOLUTE COUNT: 0.8 10*9/L — ABNORMAL LOW (ref 1.5–5.0)
MEAN CORPUSCULAR HEMOGLOBIN CONC: 35 g/dL (ref 31.0–37.0)
MEAN CORPUSCULAR HEMOGLOBIN: 29.7 pg (ref 26.0–34.0)
MEAN CORPUSCULAR VOLUME: 84.6 fL (ref 80.0–100.0)
MEAN PLATELET VOLUME: 8.4 fL (ref 7.0–10.0)
MONOCYTES ABSOLUTE COUNT: 0.3 10*9/L (ref 0.2–0.8)
NEUTROPHILS ABSOLUTE COUNT: 2.8 10*9/L (ref 2.0–7.5)
PLATELET COUNT: 132 10*9/L — ABNORMAL LOW (ref 150–440)
RED CELL DISTRIBUTION WIDTH: 15.3 % — ABNORMAL HIGH (ref 12.0–15.0)
WBC ADJUSTED: 4 10*9/L — ABNORMAL LOW (ref 4.5–11.0)

## 2017-05-07 LAB — COMPREHENSIVE METABOLIC PANEL
ALBUMIN: 4 g/dL (ref 3.5–5.0)
ALT (SGPT): 16 U/L — ABNORMAL LOW (ref 19–72)
ANION GAP: 10 mmol/L (ref 9–15)
AST (SGOT): 15 U/L — ABNORMAL LOW (ref 19–55)
BILIRUBIN TOTAL: 0.7 mg/dL (ref 0.0–1.2)
BLOOD UREA NITROGEN: 19 mg/dL (ref 7–21)
BUN / CREAT RATIO: 16
CALCIUM: 9.2 mg/dL (ref 8.5–10.2)
CHLORIDE: 101 mmol/L (ref 98–107)
CO2: 25 mmol/L (ref 22.0–30.0)
CREATININE: 1.17 mg/dL (ref 0.70–1.30)
EGFR MDRD AF AMER: >=60 mL/min/{1.73_m2} (ref >=60)
EGFR MDRD NON AF AMER: >=60 mL/min/{1.73_m2} (ref >=60)
GLUCOSE RANDOM: 143 mg/dL (ref 65–179)
POTASSIUM: 4.6 mmol/L (ref 3.5–5.0)
PROTEIN TOTAL: 7.1 g/dL (ref 6.5–8.3)

## 2017-05-07 LAB — HEMOGLOBIN: Lab: 11.6 — ABNORMAL LOW

## 2017-05-07 LAB — GAMMA GLUTAMYL TRANSFERASE: Lab: 15

## 2017-05-07 LAB — TACROLIMUS BLOOD: Lab: 5.4

## 2017-05-07 LAB — CO2: Carbon dioxide:SCnc:Pt:Ser/Plas:Qn:: 25

## 2017-05-07 MED ORDER — PROCHLORPERAZINE MALEATE 10 MG TABLET
ORAL_TABLET | Freq: Four times a day (QID) | ORAL | 2 refills | 0.00000 days | Status: CP | PRN
Start: 2017-05-07 — End: 2017-05-20

## 2017-05-07 MED FILL — ONDANSETRON ODT/8MG ODT/TBDP: ONDANSETRON ODT/8MG ODT/TBDP | 15 days supply | Qty: 30 | Fill #1

## 2017-05-07 MED FILL — PROCHLORPERAZINE/10MG/TABS: PROCHLORPERAZINE/10MG/TABS | 8 days supply | Qty: 30 | Fill #0

## 2017-05-07 NOTE — Unmapped (Signed)
Lab drawn and sent for analysis.

## 2017-05-07 NOTE — Unmapped (Signed)
It was a pleasure to see you in clinic today.  - We will proceed with cycle 1 chemotherapy today with both drugs, then etoposide alone tomorrow and the following day.  - Please pick up the prescription for Zofran (ondansetron) antinausea medication at your pharmacy  In case you need it at home    - We will see you back in clinic in 3 weeks for cycle 3  However, please call if you have any new symptoms or concerns and we will see you sooner.      Please call our nurse navigator if you have any interval questions or concerns:  Nurse Navigator: Leisa Lenz, RN:  [email or web address]  Nurse Triage Line: [phone]  ??  For emergencies, evenings or weekends, please call [phone] and ask for oncology fellow on call.  ??  For appointments, please call [phone].  For [facility] pharmacy, please call [phone].
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
## 2017-05-07 NOTE — Unmapped (Signed)
ERROR

## 2017-05-07 NOTE — Unmapped (Signed)
PIV in place in left [facility] upon arrival with good blood return. PIV infiltrated with emend. PIV removed and new one placed in right forearm. Pt tolerated chemotherapy. PIV with good blood return, left in place for chemotherapy tomorrow. Pt left ambulatory and stable

## 2017-05-07 NOTE — Unmapped (Signed)
Pharmacy: First Cycle Chemotherapy Patient Education    Chemotherapy regimen/agents: Carboplatin/etoposide  Venous Access: peripheral   Caregivers present for education: none    Michael Barrett is a 61 year old with stage IV metastatic lung cancer, mixed small cell and squamous histology. Chemotherapy education was provided to the patient by a Agricultural consultant and an oncology pharmacist.      Side effects discussed included but were not limited to:   infusion-related reactions, complications associated with myelosuppresion (such as infection/fever, fatigue, and bleeding), nausea/vomiting, constipation, mucositis, taste changes, hair loss, peripheral neuropathy or renal toxicity.    The Neulasta Onpro device was discussed, including how to recognize signs of possible device malfunction. Patient aware of the need to contact oncology team in the event of device detachment or malfunction and understands they will need to return to [facility] for a pegfilgrastim injection. Reviewed prevention and treatment of bone pain.    The [facility] patient handout, [facility] [employer] patient education card or the Hematology/Oncology Fellow on-call phone number for concerns after hours were given.The patient verbalized understanding of this information.  Medication reconciliation was completed and the medication list was updated in EPIC.      Approximate time spent with patient: 15  Minutes.    Henrene Hawking, PharmD Candidate 2019       I precepted the student in this patient encounter.    Kennon Holter, PharmD, CPP, BCOP

## 2017-05-08 ENCOUNTER — Ambulatory Visit
Admission: RE | Admit: 2017-05-08 | Discharge: 2017-05-08 | Disposition: A | Discharge status: Home / Self Care | Payer: MEDICARE

## 2017-05-08 DIAGNOSIS — C3431 Malignant neoplasm of lower lobe, right bronchus or lung: Principal | ICD-10-CM

## 2017-05-08 DIAGNOSIS — C7951 Secondary malignant neoplasm of bone: Secondary | ICD-10-CM

## 2017-05-08 MED ORDER — OXYCODONE 10 MG TABLET
ORAL_TABLET | ORAL | 0 refills | 0 days | Status: CP | PRN
Start: 2017-05-08 — End: 2017-06-12

## 2017-05-08 NOTE — Unmapped (Signed)
Pt also for Xgeva today.

## 2017-05-08 NOTE — Unmapped (Signed)
Blood return noted from RFA IV.  Premeds given. No issues reported.  To lobby to await treatment.

## 2017-05-08 NOTE — Unmapped (Unsigned)
Specialty Pharmacy Refill Coordination Note     Michael Barrett is a 61 y.o. male contacted today regarding refills of his specialty medication(s).    Reviewed and verified with patient:      Specialty medication(s) and dose(s) confirmed: yes  Changes to medications: no  Changes to insurance: no    Medication Adherence    Patient reported X missed doses in the last month:  0  Specialty Medication:  Entecavir 0.5mg   Patient is on additional specialty medications:  Yes  Additional Specialty Medications:  Prograf 1mg   Patient Reported Additional Medication X Missed Doses in the Last Month:  0  Informant:  patient  Reliability of informant:  reliable  Provider-estimated medication adherence level:  90-100%  Patient is at risk for Non-Adherence:  No  Confirmed plan for next specialty medication refill:  delivery by pharmacy  Medication Assistance Program  Refill Coordination  Has the Patient's Contact Information Changed:  No  Is the Shipping Address Different:  No  Shipping Information  Delivery Scheduled:  Yes  Delivery Date:  05/14/17  Medications to be Shipped:  Entecavir 0.5mg , Prograf 1mg           Follow-up: 1 month(s)     Michael Barrett  Specialty Pharmacy Technician

## 2017-05-08 NOTE — Unmapped (Signed)
I met with Michael Barrett in infusion after touching base with his liver transplant team regarding the monitoring of his tacrolimus during chemotherapy. We will obtain trough levels concurrently with lab draws every three weeks prior to his next cycle of chemotherapy. His transplant team will call him to follow up on the levels and make adjustments.     I advised him to hold his morning dose of tacrolimus on 8/14 so that we can obtain his next trough level at that visit.     His last level on 7/23 was 5.4, which is within his goal range of 4-6.     Leotis Shames, PharmD, BCOP, CPP  Pager [phone]

## 2017-05-08 NOTE — Unmapped (Signed)
Pt in for treatment, pt tolerated treatment without incident. Pt also received xgeva injection. PIV flushed and removed post infusion. Pt discharged stable with AVS in hand.

## 2017-05-08 NOTE — Unmapped (Signed)
Refilled needed for pain med

## 2017-05-09 ENCOUNTER — Ambulatory Visit
Admission: RE | Admit: 2017-05-09 | Discharge: 2017-05-09 | Disposition: A | Discharge status: Home / Self Care | Payer: MEDICARE

## 2017-05-09 DIAGNOSIS — C3431 Malignant neoplasm of lower lobe, right bronchus or lung: Principal | ICD-10-CM

## 2017-05-09 DIAGNOSIS — C7951 Secondary malignant neoplasm of bone: Secondary | ICD-10-CM

## 2017-05-09 NOTE — Unmapped (Signed)
Pt presents in NAD, no complaints, pain free, no negative changes since last visit. PIV placed in triage prior to arrival in treatment area. +blood return    1455  Pt tolerated infusion without difficulty. PIV removed, pt left infusion center ambulatory,steady gait, NAD, no questions, complaints, nor concerns voiced at d/c.     Neulasta On Body injector placed (Left Arm). After waiting three minutes, device activated, confirmed by green light and by visualizing Full in window.  Pt verbalized understanding of approximate time injection device may be removed. Understands there will be an alarm that will let them know when to remove device.

## 2017-05-09 NOTE — Unmapped (Signed)
24 Ga IV started in left wrist srea, no labs today.  No issues reported.  To lobby after premeds to await treatment.

## 2017-05-09 NOTE — Unmapped (Signed)
If you feel like you're having an emergency please call 911.  For appointments or questions Monday through Friday 8AM-5PM please call [phone] or Toll Free [phone]. For Medical questions or concerns ask for the Nurse Triage Line.  On Nights, Weekends, and Holidays call [phone] and ask for the Oncologist on Call.  Reasons to call the Nurse Triage Line:  Fever of 100.5 or greater  Nausea and/or vomiting not relived with nausea medicine  Diarrhea or constipation  Severe pain not relieved with usual pain regimen  Shortness of breath  Uncontrolled bleeding  Mental status changes

## 2017-05-10 LAB — LOG10 CMV QN DNA PL

## 2017-05-13 MED FILL — ENTECAVIR/0.5MG/TABS: ENTECAVIR/0.5MG/TABS | 30 days supply | Qty: 30 | Fill #1

## 2017-05-13 MED FILL — PROGRAF/1MG/CAP: PROGRAF/1MG/CAP | 30 days supply | Qty: 150 | Fill #1

## 2017-05-13 NOTE — Unmapped (Signed)
[facility]  Comprehensive Cancer Support Program/Psychiatry   Established Patient E&M Service and Psychotherapy      Name: Michael Barrett  Date: 05/07/2017  MRN: [id number]  DOB: 27-Aug-1956  PCP: Aida Puffer, MD  Oncologist: Dr. Janann August       Assessment:  Patient is a 61 y.o., male with a history of  Stage IV c-SCLC (small cell+squamous cell), HCV cirrhosis complicated by [facility] s/p liver transplant 03/07/16 and on tacrolimus, who presents for treatment of possible depression. He has a past psychiatric history of remote substance abuse and some intermittent depression but he has never taken any psychiatric meds. He says that he is doing better now and that he has only intermittent periods of depression that do not last long. He seems to appreciate the support, however, so I will continue to be available to him for supportive therapy and possible psych med management.      Risk Assessment:  A suicide and violence risk assessment was performed as part of this evaluation. There patient is deemed to be at chronic elevated risk for self-harm/suicide given the following factors: recent onset of serious medical condition and chronic severe medical condition. There patient is deemed to be at chronic elevated risk for violence given the following factors: male gender. These risk factors are mitigated by the following factors:lack of active SI/HI, no know access to weapons or firearms, no history of previous suicide attempts , no history of violence, motivation for treatment, utilization of positive coping skills, supportive family, presence of a significant relationship, presence of an available support system, enjoyment of leisure actvities, expresses purpose for living, religious or spiritual prohibition to suicide/violence, current treatment compliance, effective problem solving skills, safe housing, support system in agreement with treatment recommendations and presence of a safety plan with follow-up care. There is no acute risk for suicide or violence at this time. The patient was educated about relevant modifiable risk factors including following recommendations for treatment of psychiatric illness and abstaining from substance abuse.    While future psychiatric events cannot be accurately predicted, the patient does not currently require  acute inpatient psychiatric care and does not currently meet [facility] involuntary commitment criteria.      Diagnoses:   Patient Active Problem List   Diagnosis   ??? Cirrhosis of liver (CMS-HCC)   ??? Malignant neoplasm of liver, primary (CMS-HCC)   ??? Hepatitis C   ??? Hepatocellular carcinoma (CMS-HCC)   ??? Liver mass   ??? Injury of wrist   ??? Liver replaced by transplant (CMS-HCC)   ??? 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 Spinal cord compression (CMS-HCC)   ??? Bony metastasis (CMS-HCC)   ??? Lung nodule   ??? CKD (chronic kidney disease)   ??? Malignant neoplasm of lower lobe of right lung (CMS-HCC)   ??? Tobacco abuse        Stressors: recent diagnosis of cancer  Disability Assessment Scale:WHODAS clinically estimated as 30-40         Plan:  #Adjustment Reaction  - Pt declines meds today  - Provided supportive and problem-solving psychotherapy to address his illness and re enforced the good coping skills he has used thus far to help him cope.ie. thinking why worry about something you can't change? and telling himself after one day of feeling bad to do something enjoyable/fun (e.g., yardwork, working on vehicles, etc.) and his mood would improve.     Patient has been given this writer's business card with  confidential voicemail number. He has been instructed to call 911 for emergencies.  Follow-up with me in 3 weeks.    Revised Medication(s) Post Visit:  Outpatient Encounter Prescriptions as of 05/07/2017   Medication Sig Dispense Refill   ??? aspirin (ECOTRIN) 81 MG tablet Take 1 tablet (81 mg total) by mouth daily. 30 tablet 11   ??? blood-glucose meter (GLUCOSE MONITORING KIT) kit Use as instructed 1 each 0   ??? calcium carbonate-vitamin D3 (CALCIUM 600 WITH VITAMIN D3) 600 mg(1,500mg ) -400 unit Chew Chew 1 tablet daily.     ??? cholecalciferol, vitamin D3, 400 unit Tab Take 1 tablet (400 Units total) by mouth daily. 30 each 3   ??? cyclobenzaprine (FLEXERIL) 5 MG tablet Take 1 tablet (5 mg total) by mouth Three (3) times a day as needed for muscle spasms. 30 tablet 2   ??? entecavir (BARACLUDE) 0.5 MG tablet Take 1 tablet (0.5 mg total) by mouth daily. 90 tablet 3   ??? GLUCOSE BLOOD test strip by Other route Three (3) times a day before meals. Please dispense relion test strips E 13.9 90 strip 6   ??? lidocaine 2% viscous (XYLOCAINE) 2 % Soln 10 mL by Mouth route every three (3) hours as needed (for pain with swallowing). Gargle gently and swallow (Patient not taking: Reported on 04/30/2017) 300 mL 1   ??? magnesium oxide (MAG-OX) 400 mg tablet Take 400 mg by mouth daily.     ??? metFORMIN (GLUCOPHAGE XR) 500 MG 24 hr tablet Take 1 tablet (500 mg total) by mouth daily with breakfast. E 13.9 30 tablet 6   ??? ondansetron (ZOFRAN) 8 MG tablet Take 1 tablet (8 mg total) by mouth every eight (8) hours as needed for nausea. (Patient not taking: Reported on 05/07/2017) 30 tablet 2   ??? prochlorperazine (COMPAZINE) 10 MG tablet Take 1 tablet (10 mg total) by mouth every six (6) hours as needed for nausea. 30 tablet 2   ??? PROGRAF 1 mg capsule 3 mg in the morning, 2 mg at bedtime 2 capsule 0     Facility-Administered Encounter Medications as of 05/07/2017   Medication Dose Route Frequency Provider Last Rate Last Dose   ??? [COMPLETED] CARBOplatin (PARAPLATIN) 444.8 mg in sodium chloride (NS) 0.9% 100 mL IVPB  444.8 mg Intravenous Once Caroll Rancher, MD   Stopped at 05/07/17 1427   ??? dexamethasone (DECADRON) 4 mg/mL injection 8 mg  8 mg Intravenous Once PRN Caroll Rancher, MD       ??? [COMPLETED] dexamethasone (DECADRON) tablet 8 mg  8 mg Oral Once Caroll Rancher, MD   8 mg at 05/07/17 1248   ??? etoposide (VEPESID) 172.8 mg in sodium chloride 0.9% NON-PVC (NS) 500 mL IVPB  80 mg/m2 (Treatment Plan Recorded) Intravenous Once Caroll Rancher, MD 548.6 mL/hr at 05/07/17 1431 172.8 mg at 05/07/17 1431   ??? [COMPLETED] fosaprepitant (EMEND) 150 mg in sodium chloride (NS) 0.9% 100 mL IVPB  150 mg Intravenous Once Caroll Rancher, MD   Stopped at 05/07/17 1340   ??? OKAY TO SEND MEDICATION/CHEMOTHERAPY TO UNIT   Other Once Caroll Rancher, MD       ??? ondansetron [facility]) injection 8 mg  8 mg Intravenous Once PRN Caroll Rancher, MD       ??? [COMPLETED] ondansetron [facility]) tablet 24 mg  24 mg Oral Once Caroll Rancher, MD   24 mg at 05/07/17 1249   ??? sodium chloride (NS) 0.9% infusion  100 mL/hr Intravenous Continuous Caroll Rancher, MD 100 mL/hr at 05/07/17 1254 100 mL/hr at 05/07/17 1254          Subjective:          He was seen in infusion. He denies depression to me. He says that he will have brief periods of depression, but it does not last. He says that he is sleeping on and off but that has been a chronic issue for him over the many years. He has used trazodone with some success. He denies trouble with anhedonia, concentration, guilt, low appetite or SI. He has some fatigue but says it is improving. 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 He has some interests and he is really hoping that he can do more of them. He loves the play guitar and ride his motorcycle and fish and camp.          He says that he feels like he has been dealing with health issues for the last several years, and he indeed has. Starting in 2015, he was diagnosed with esophageal varices and then Hep C and then liver cancer and a liver transplant and now he has metastatic cancer. He says that he was depressed when it all started but he has been adjusting and he says he usually does not stay down for long. He did admit to seeing a counselor at [facility] for some session when this all started. He was evaluated by the psychiatry transplant team in 11/2015 and found to have some intermittent mood symptoms, sleep trouble and remote substance abuse (etoh and MJ). He says that he was depressed and not eating or sleeping when he was first diagnosed but he feels more positive now that he has had some treatment.          He has been married for 35 years and his extended family is in [place]. He worked in [employer]. Last year, his sister had breast cancer and his brother in law died of cancer as did his dog. He has a high school education and he served in [employer]. He denies current SI and he says that his main reason for living is his wife    Medications/Allergies: reviewed    Medical History/Surgical History/Social history:reviewed    Medication(s) on Presentation:   Outpatient Medications Prior to Visit   Medication Sig Dispense Refill   ??? aspirin (ECOTRIN) 81 MG tablet Take 1 tablet (81 mg total) by mouth daily. 30 tablet 11   ??? blood-glucose meter (GLUCOSE MONITORING KIT) kit Use as instructed 1 each 0   ??? calcium carbonate-vitamin D3 (CALCIUM 600 WITH VITAMIN D3) 600 mg(1,500mg ) -400 unit Chew Chew 1 tablet daily.     ??? cholecalciferol, vitamin D3, 400 unit Tab Take 1 tablet (400 Units total) by mouth daily. 30 each 3   ??? cyclobenzaprine (FLEXERIL) 5 MG tablet Take 1 tablet (5 mg total) by mouth Three (3) times a day as needed for muscle spasms. 30 tablet 2   ??? entecavir (BARACLUDE) 0.5 MG tablet Take 1 tablet (0.5 mg total) by mouth daily. 90 tablet 3   ??? GLUCOSE BLOOD test strip by Other route Three (3) times a day before meals. Please dispense relion test strips E 13.9 90 strip 6   ??? lidocaine 2% viscous (XYLOCAINE) 2 % Soln 10 mL by Mouth route every three (3) hours  as needed (for pain with swallowing). Gargle gently and swallow (Patient not taking: Reported on 04/30/2017) 300 mL 1   ??? magnesium oxide (MAG-OX) 400 mg tablet Take 400 mg by mouth daily.     ??? metFORMIN (GLUCOPHAGE XR) 500 MG 24 hr tablet Take 1 tablet (500 mg total) by mouth daily with breakfast. E 13.9 30 tablet 6   ??? ondansetron (ZOFRAN) 8 MG tablet Take 1 tablet (8 mg total) by mouth every eight (8) hours as needed for nausea. (Patient not taking: Reported on 05/07/2017) 30 tablet 2   ??? prochlorperazine (COMPAZINE) 10 MG tablet Take 1 tablet (10 mg total) by mouth every six (6) hours as needed for nausea. 30 tablet 2   ??? PROGRAF 1 mg capsule 3 mg in the morning, 2 mg at bedtime 2 capsule 0     Facility-Administered Medications Prior to Visit   Medication Dose Route Frequency Provider Last Rate Last Dose   ??? dexamethasone (DECADRON) 4 mg/mL injection 8 mg  8 mg Intravenous Once PRN Caroll Rancher, MD       ??? etoposide (VEPESID) 172.8 mg in sodium chloride 0.9% NON-PVC (NS) 500 mL IVPB  80 mg/m2 (Treatment Plan Recorded) Intravenous Once Caroll Rancher, MD 548.6 mL/hr at 05/07/17 1431 172.8 mg at 05/07/17 1431   ??? OKAY TO SEND MEDICATION/CHEMOTHERAPY TO UNIT   Other Once Caroll Rancher, MD       ??? ondansetron [facility]) injection 8 mg  8 mg Intravenous Once PRN Caroll Rancher, MD       ??? sodium chloride (NS) 0.9% infusion  100 mL/hr Intravenous Continuous Caroll Rancher, MD 100 mL/hr at 05/07/17 1254 100 mL/hr at 05/07/17 1254       ROS: The balance of 10 systems is negative except for the following: back pain     Objective:    Vitals:   There were no vitals filed for this visit.    Mental Status Exam:  Appearance:    Appears stated age   Motor:   No abnormal movements   Speech/Language:    Normal rate, volume, tone, fluency   Mood:   ok   Affect:   calm, cooperative, dysthymic    Thought process:   Logical, linear, clear, coherent, goal directed   Thought content:     Denies SI, HI, self harm, delusions, obsessions, paranoid ideation, or ideas of reference   Perceptual disturbances:     Denies auditory and visual hallucinations, behavior not concerning for response to internal stimuli     Orientation:   Oriented to person, place, time, and general circumstances   Attention:   Able to fully attend without fluctuations in consciousness   Concentration:   Able to fully concentrate and attend   Memory:   Immediate, short-term, long-term, and recall grossly intact    Fund of knowledge:    Consistent with level of education and development   Insight:     Intact   Judgment:    Intact   Impulse Control:   Intact     PE:   Gen: in NAD  Neuro: Cranial nerves II-XII grossly intact.     Test Results:  Data Review:   Lab results last 24 hours:    Recent Results (from the past 24 hour(s))   Comprehensive Metabolic Panel    Collection Time: 05/07/17  9:22 AM   Result Value Ref Range    Sodium 136 135 - 145 mmol/L    Potassium  4.6 3.5 - 5.0 mmol/L    Chloride 101 98 - 107 mmol/L    CO2 25.0 22.0 - 30.0 mmol/L    BUN 19 7 - 21 mg/dL    Creatinine 1.61 0.96 - 1.30 mg/dL    BUN/Creatinine Ratio 16     EGFR MDRD Non Af Amer >=60 >=60 mL/min/1.62m2    EGFR MDRD Af Amer >=60 >=60 mL/min/1.66m2    Anion Gap 10 9 - 15 mmol/L    Glucose 143 65 - 179 mg/dL    Calcium 9.2 8.5 - 04.5 mg/dL    Albumin 4.0 3.5 - 5.0 g/dL    Total Protein 7.1 6.5 - 8.3 g/dL    Total Bilirubin 0.7 0.0 - 1.2 mg/dL    AST 15 (L) 19 - 55 U/L    ALT 16 (L) 19 - 72 U/L    Alkaline Phosphatase 103 38 - 126 U/L   CBC w/ Differential    Collection Time: 05/07/17  9:22 AM   Result Value Ref Range    WBC 4.0 (L) 4.5 - 11.0 10*9/L    RBC 3.91 (L) 4.50 - 5.90 10*12/L    HGB 11.6 (L) 13.5 - 17.5 g/dL    HCT 40.9 (L) 81.1 - 53.0 %    MCV 84.6 80.0 - 100.0 fL    MCH 29.7 26.0 - 34.0 pg    MCHC 35.0 31.0 - 37.0 g/dL    RDW 91.4 (H) 78.2 - 15.0 %    MPV 8.4 7.0 - 10.0 fL    Platelet 132 (L) 150 - 440 10*9/L    Variable HGB Concentration Slight (A) Not Present    Absolute Neutrophils 2.8 2.0 - 7.5 10*9/L    Absolute Lymphocytes 0.8 (L) 1.5 - 5.0 10*9/L    Absolute Monocytes 0.3 0.2 - 0.8 10*9/L    Absolute Eosinophils 0.1 0.0 - 0.4 10*9/L    Absolute Basophils 0.0 0.0 - 0.1 10*9/L    Large Unstained Cells 1 0 - 4 %    Hyperchromasia Slight (A) Not Present     Imaging: none    Unknown Schleyer Sherlynn Stalls, PA-C, MA   05/07/2017

## 2017-05-14 ENCOUNTER — Ambulatory Visit: Admission: RE | Admit: 2017-05-14 | Discharge: 2017-05-14 | Payer: MEDICARE

## 2017-05-14 DIAGNOSIS — C3431 Malignant neoplasm of lower lobe, right bronchus or lung: Principal | ICD-10-CM

## 2017-05-14 DIAGNOSIS — C7951 Secondary malignant neoplasm of bone: Secondary | ICD-10-CM

## 2017-05-15 NOTE — Unmapped (Signed)
Comprehensive Cancer Support Program - Psychiatry Outpatient Clinic   After Visit Summary    Continue these medicines:     Medication changes:none today     Our next appointment is in 3 weeks     [facility]: [phone].     If you are taking any controlled substances (such as anxiety or sleep medications), you must use them as the directions say to use them. We cannot provide early refills, and it would be inappropriate to obtain the medications from other doctors. We routinely use the [place] controlled substance database to monitor prescription drug use.     If you need to get in touch with me for a non-urgent issue, the best way to do so is through sending a [facility] [employer] using the Patient Advice Request.    You can also contact Myrene Galas, the [facility] Clinic Coordinator, at [phone] for any scheduling issues, or if you have an issue during business hours.    In order to receive updates on clinic closings due to adverse weather,   please call [phone].    The [place] crisis psychiatry line can be reached for after hours urgent issues at:   [phone]. You can also call the I need to talk line-- 1-800-273-TALK ([phone]) connects you to a skilled, trained counselor at a crisis center in your area, anytime 24/7.

## 2017-05-20 MED ORDER — PROCHLORPERAZINE MALEATE 10 MG TABLET
ORAL_TABLET | Freq: Four times a day (QID) | ORAL | 1 refills | 0.00000 days | Status: CP | PRN
Start: 2017-05-20 — End: ?

## 2017-05-20 NOTE — Unmapped (Signed)
Call from wife.  Patient and wife out of state at family reunion.  Patient appetite really good until last pm.  Patient became nauseated and took last antiemetic. Wife calling to obtain refill in [place], [place].  Requesting sent to [facility] - only one in that city.  After looking on line, found [facility].  [facility].   MD aware.   Wife also stated that patient was angry and very short in temper. Asked that Palliative care/Psych provider that  had just seen to patient call wife.  Message sent to Marianna Payment, PA to please contact wife.   Support given to spouse and encouraged to allow family to support her while they are there.

## 2017-05-21 NOTE — Unmapped (Signed)
Left message for wife to call me back in response to last phone note and his increased anger and depression.   Kennedy Brines L. Gwinda Passe, MA   Comprehensive Cancer Support Program   [phone]

## 2017-05-28 ENCOUNTER — Ambulatory Visit
Admission: RE | Admit: 2017-05-28 | Discharge: 2017-05-28 | Disposition: A | Discharge status: Home / Self Care | Admitting: Hematology & Oncology

## 2017-05-28 ENCOUNTER — Ambulatory Visit
Admission: RE | Admit: 2017-05-28 | Discharge: 2017-05-28 | Disposition: A | Discharge status: Home / Self Care | Payer: MEDICARE

## 2017-05-28 ENCOUNTER — Ambulatory Visit
Admission: RE | Admit: 2017-05-28 | Discharge: 2017-05-28 | Disposition: A | Discharge status: Home / Self Care | Admitting: Physician Assistant

## 2017-05-28 ENCOUNTER — Ambulatory Visit
Admission: RE | Admit: 2017-05-28 | Discharge: 2017-05-28 | Disposition: A | Discharge status: Home / Self Care | Attending: Hematology & Oncology | Admitting: Hematology & Oncology

## 2017-05-28 DIAGNOSIS — F418 Other specified anxiety disorders: Secondary | ICD-10-CM

## 2017-05-28 DIAGNOSIS — C3431 Malignant neoplasm of lower lobe, right bronchus or lung: Principal | ICD-10-CM

## 2017-05-28 DIAGNOSIS — C7951 Secondary malignant neoplasm of bone: Secondary | ICD-10-CM

## 2017-05-28 DIAGNOSIS — F4323 Adjustment disorder with mixed anxiety and depressed mood: Principal | ICD-10-CM

## 2017-05-28 DIAGNOSIS — Z944 Liver transplant status: Principal | ICD-10-CM

## 2017-05-28 DIAGNOSIS — C3490 Malignant neoplasm of unspecified part of unspecified bronchus or lung: Principal | ICD-10-CM

## 2017-05-28 LAB — COMPREHENSIVE METABOLIC PANEL
ALBUMIN: 3.9 g/dL (ref 3.5–5.0)
ALKALINE PHOSPHATASE: 131 U/L — ABNORMAL HIGH (ref 38–126)
ALT (SGPT): 33 U/L (ref 19–72)
ANION GAP: 10 mmol/L (ref 9–15)
AST (SGOT): 24 U/L (ref 19–55)
BLOOD UREA NITROGEN: 20 mg/dL (ref 7–21)
BUN / CREAT RATIO: 15
CALCIUM: 8.4 mg/dL — ABNORMAL LOW (ref 8.5–10.2)
CHLORIDE: 106 mmol/L (ref 98–107)
CO2: 19 mmol/L — ABNORMAL LOW (ref 22.0–30.0)
CREATININE: 1.36 mg/dL — ABNORMAL HIGH (ref 0.70–1.30)
EGFR MDRD AF AMER: >=60 mL/min/{1.73_m2} (ref >=60)
EGFR MDRD NON AF AMER: 53 mL/min/{1.73_m2} — ABNORMAL LOW (ref >=60)
GLUCOSE RANDOM: 134 mg/dL (ref 65–179)
POTASSIUM: 4.9 mmol/L (ref 3.5–5.0)
PROTEIN TOTAL: 7 g/dL (ref 6.5–8.3)
SODIUM: 135 mmol/L (ref 135–145)

## 2017-05-28 LAB — TACROLIMUS, TROUGH: Lab: 8.4

## 2017-05-28 LAB — CBC W/ AUTO DIFF
BASOPHILS ABSOLUTE COUNT: 0 10*9/L (ref 0.0–0.1)
EOSINOPHILS ABSOLUTE COUNT: 0 10*9/L (ref 0.0–0.4)
HEMATOCRIT: 28.8 % — ABNORMAL LOW (ref 41.0–53.0)
HEMOGLOBIN: 9.8 g/dL — ABNORMAL LOW (ref 13.5–17.5)
LARGE UNSTAINED CELLS: 2 % (ref 0–4)
LYMPHOCYTES ABSOLUTE COUNT: 0.7 10*9/L — ABNORMAL LOW (ref 1.5–5.0)
MEAN CORPUSCULAR HEMOGLOBIN CONC: 34.1 g/dL (ref 31.0–37.0)
MEAN CORPUSCULAR HEMOGLOBIN: 29.5 pg (ref 26.0–34.0)
MEAN CORPUSCULAR VOLUME: 86.4 fL (ref 80.0–100.0)
MONOCYTES ABSOLUTE COUNT: 0.4 10*9/L (ref 0.2–0.8)
NEUTROPHILS ABSOLUTE COUNT: 5.2 10*9/L (ref 2.0–7.5)
PLATELET COUNT: 257 10*9/L (ref 150–440)
RED BLOOD CELL COUNT: 3.33 10*12/L — ABNORMAL LOW (ref 4.50–5.90)
RED CELL DISTRIBUTION WIDTH: 19.7 % — ABNORMAL HIGH (ref 12.0–15.0)
WBC ADJUSTED: 6.4 10*9/L (ref 4.5–11.0)

## 2017-05-28 LAB — CHLORIDE: Chloride:SCnc:Pt:Ser/Plas:Qn:: 106

## 2017-05-28 LAB — ANISOCYTOSIS

## 2017-05-28 MED ORDER — MIRTAZAPINE 15 MG TABLET
ORAL_TABLET | 3 refills | 0 days | Status: CP
Start: 2017-05-28 — End: ?

## 2017-05-28 MED ORDER — CYCLOBENZAPRINE 5 MG TABLET
ORAL_TABLET | Freq: Three times a day (TID) | ORAL | 2 refills | 0 days | Status: CP | PRN
Start: 2017-05-28 — End: 2017-07-16

## 2017-05-28 NOTE — Unmapped (Signed)
If you feel like this is an emergency please call 911.  For appointments or questions Monday through Friday 8AM-5PM please call [phone] or Toll Free [phone]. For Medical questions or concerns ask for the Nurse Triage Line.  On Nights, Weekends, and Holidays call [phone] and ask for the Oncologist on Call.  Reasons to call the Nurse Triage Line:  Fever of 100.5 or greater  Nausea and/or vomiting not relieved with nausea medicine  Diarrhea or constipation  Severe pain not relieved with usual pain regimen  Shortness of breath  Uncontrolled bleeding  Mental status changes

## 2017-05-28 NOTE — Unmapped (Signed)
[facility]  Comprehensive Cancer Support Program/Psychiatry   Established Patient E&M Service and Psychotherapy      Name: Michael Barrett  Date: 05/28/2017  MRN: [id number]  DOB: Mar 02, 1956  PCP: Aida Puffer, MD  Oncologist: Dr. 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 He has a past psychiatric history of remote substance abuse and some intermittent depression but he has never taken any psychiatric meds. Originally, he denied depression but today he admitted to increased anger, irritability and tearfulness. He agreed to trial an antidepressant so we will start some mirtazapine today.  I will continue to be available to him for supportive therapy and possible psych med management.    ??  Risk Assessment:  A suicide and violence risk assessment was performed as part of this evaluation. There patient is deemed to be at chronic elevated risk for self-harm/suicide given the following factors: recent onset of serious medical condition and chronic severe medical condition. There patient is deemed to be at chronic elevated risk for violence given the following factors: male gender. These risk factors are mitigated by the following factors:lack of active SI/HI, no know access to weapons or firearms, no history of previous suicide attempts , no history of violence, motivation for treatment, utilization of positive coping skills, supportive family, presence of a significant relationship, presence of an available support system, enjoyment of leisure actvities, expresses purpose for living, religious or spiritual prohibition to suicide/violence, current treatment compliance, effective problem solving skills, safe housing, support system in agreement with treatment recommendations and presence of a safety plan with follow-up care. There is no acute risk for suicide or violence at this time. The patient was educated about relevant modifiable risk factors including following recommendations for treatment of psychiatric illness and abstaining from substance abuse.               While future psychiatric events cannot be accurately predicted, the patient does not currently require  acute inpatient psychiatric care and does not currently meet [facility] involuntary commitment criteria.       Diagnoses:   Patient Active Problem List   Diagnosis   ??? Cirrhosis of liver (CMS-HCC)   ??? Malignant neoplasm of liver, primary (CMS-HCC)   ??? Hepatitis C   ??? Hepatocellular carcinoma (CMS-HCC)   ??? Liver mass   ??? Injury of wrist   ??? Liver replaced by transplant (CMS-HCC)   ??? 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 Spinal cord compression (CMS-HCC)   ??? Bony metastasis (CMS-HCC)   ??? Lung nodule   ??? CKD (chronic kidney disease)   ??? Malignant neoplasm of lower lobe of right lung (CMS-HCC)   ??? Tobacco abuse   ??? On antineoplastic chemotherapy        Stressors:recent diagnosis of cancer, several serious illness preceding this one   Disability Assessment Scale: WHODAS clinically estimated as 30-40         Plan:  # Adjustment Reaction  - Start mirtazapine 7.5 mg x 1 week and then increase to 15 mg   - Provided supportive and problem-solving psychotherapy to address his anger and depression     # Caregiver stress  - Notified him of weekly caregiver support group for wife and he says they will try to make  arrangements for her to go.     Patient has been given this writer's business card with confidential voicemail number. He has been instructed to call [phone] for emergencies.  Follow-up with me in 4 weeks.    Revised Medication(s) Post Visit:  Outpatient Encounter Prescriptions as of 05/28/2017   Medication Sig Dispense Refill   ??? aspirin (ECOTRIN) 81 MG tablet Take 1 tablet (81 mg total) by mouth daily. 30 tablet 11   ??? blood-glucose meter (GLUCOSE MONITORING KIT) kit Use as instructed 1 each 0   ??? calcium carbonate-vitamin D3 (CALCIUM 600 WITH VITAMIN D3) 600 mg(1,500mg ) -400 unit Chew Chew 1 tablet daily.     ??? cholecalciferol, vitamin D3, 400 unit Tab Take 1 tablet (400 Units total) by mouth daily. 30 each 3   ??? entecavir (BARACLUDE) 0.5 MG tablet Take 1 tablet (0.5 mg total) by mouth daily. 90 tablet 3   ??? GLUCOSE BLOOD test strip by Other route Three (3) times a day before meals. Please dispense relion test strips E 13.9 90 strip 6   ??? lidocaine 2% viscous (XYLOCAINE) 2 % Soln 10 mL by Mouth route every three (3) hours as needed (for pain with swallowing). Gargle gently and swallow (Patient not taking: Reported on 04/30/2017) 300 mL 1   ??? magnesium oxide (MAG-OX) 400 mg tablet Take 400 mg by mouth daily.     ??? metFORMIN (GLUCOPHAGE XR) 500 MG 24 hr tablet Take 1 tablet (500 mg total) by mouth daily with breakfast. E 13.9 30 tablet 6   ??? oxyCODONE (ROXICODONE) 10 mg immediate release tablet Take 1 tablet (10 mg total) by mouth every four (4) hours as needed for pain. Neoplasm related pain 100 tablet 0   ??? prochlorperazine (COMPAZINE) 10 MG tablet Take 1 tablet (10 mg total) by mouth every six (6) hours as needed for nausea. for up to 90 doses 90 tablet 1   ??? PROGRAF 1 mg capsule 3 mg in the morning, 2 mg at bedtime 2 capsule 0   ??? [DISCONTINUED] cyclobenzaprine (FLEXERIL) 5 MG tablet Take 1 tablet (5 mg total) by mouth Three (3) times a day as needed for muscle spasms. 30 tablet 2     No facility-administered encounter medications on file as of 05/28/2017.         Subjective:   He was seen in infusion, his wife was not with him today. (She is in the hotel with the puppy.)   He reports sleeping on and off, has to be up to urinate a lot. Feels like he has no control, powerless. Being in limbo. He has ups and downs and the anger is more frequent. Wife is recipient of a lot of his anger and he feels very badly about that but he says he cannot help it. They are together all of the time but he will go to a friend's house when he is in a really bad mood.   He was in [place] with his family for 2 weeks and he says that was nice. He put his mother in nursing home but she seems happy. He saw his brother and 2 sisters and all of his nieces and nephews.  His appetite is getting better, picking up.   Neulasta makes his bones ache, chemo makes him fatigued. He has a lot of fatigue. Chemo is every 3 weeks and he hates the limbo.   He tried Bupropion in the past and did not feel right and does not want that  again but he is willing to trial something else to help him with his mood and anger today. He denies mania or SI.     Medications/Allergies: reviewed    Medical History/Surgical History/Social history:reviewed    Medication(s) on Presentation:   Outpatient Medications Prior to Visit   Medication Sig Dispense Refill   ??? aspirin (ECOTRIN) 81 MG tablet Take 1 tablet (81 mg total) by mouth daily. 30 tablet 11   ??? blood-glucose meter (GLUCOSE MONITORING KIT) kit Use as instructed 1 each 0   ??? calcium carbonate-vitamin D3 (CALCIUM 600 WITH VITAMIN D3) 600 mg(1,500mg ) -400 unit Chew Chew 1 tablet daily.     ??? cholecalciferol, vitamin D3, 400 unit Tab Take 1 tablet (400 Units total) by mouth daily. 30 each 3   ??? entecavir (BARACLUDE) 0.5 MG tablet Take 1 tablet (0.5 mg total) by mouth daily. 90 tablet 3   ??? GLUCOSE BLOOD test strip by Other route Three (3) times a day before meals. Please dispense relion test strips E 13.9 90 strip 6   ??? lidocaine 2% viscous (XYLOCAINE) 2 % Soln 10 mL by Mouth route every three (3) hours as needed (for pain with swallowing). Gargle gently and swallow (Patient not taking: Reported on 04/30/2017) 300 mL 1   ??? magnesium oxide (MAG-OX) 400 mg tablet Take 400 mg by mouth daily.     ??? metFORMIN (GLUCOPHAGE XR) 500 MG 24 hr tablet Take 1 tablet (500 mg total) by mouth daily with breakfast. E 13.9 30 tablet 6   ??? oxyCODONE (ROXICODONE) 10 mg immediate release tablet Take 1 tablet (10 mg total) by mouth every four (4) hours as needed for pain. Neoplasm related pain 100 tablet 0   ??? prochlorperazine (COMPAZINE) 10 MG tablet Take 1 tablet (10 mg total) by mouth every six (6) hours as needed for nausea. for up to 90 doses 90 tablet 1   ??? PROGRAF 1 mg capsule 3 mg in the morning, 2 mg at bedtime 2 capsule 0     No facility-administered medications prior to visit.        ROS: The balance of 10 systems is negative except for the following:back pain     Objective:    Vitals:   There were no vitals filed for this visit.    Mental Status Exam:  Appearance:    Appears stated age   Motor:   No abnormal movements   Speech/Language:    Normal rate, volume, tone, fluency   Mood:   angry, irritable    Affect:   calm, cooperative, depressed   Thought process:   Logical, linear, clear, coherent, goal directed   Thought content:     Denies SI, HI, self harm, delusions, obsessions, paranoid ideation, or ideas of reference   Perceptual disturbances:     Denies auditory and visual hallucinations, behavior not concerning for response to internal stimuli     Orientation:   Oriented to person, place, time, and general circumstances   Attention:   Able to fully attend without fluctuations in consciousness   Concentration:   Able to fully concentrate and attend   Memory:   Immediate, short-term, long-term, and recall grossly intact    Fund of knowledge:    Consistent with level of education and development   Insight:     Intact   Judgment:    Intact   Impulse Control:   Intact     PE:   Gen: in NAD  Neuro: Cranial nerves  II-XII grossly intact, normal gait, no tremor observed.     Test Results:  Data Review: Lab results last 24 hours:  No results found for this or any previous visit (from the past 24 hour(s)).  Imaging: none     Marc Morgans, [place]   05/28/2017
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
## 2017-05-28 NOTE — Unmapped (Signed)
1545-Patient in clinic for treatment. Received treatment without complications. At completion of treatment PIV flushed, blood return noted and PIV left in place for use in infusion clinic tomorrow. Gauze and coban dressing used to secure the PIV. Patient stable at discharge.

## 2017-05-28 NOTE — Unmapped (Signed)
Lab drawn and sent for analysis.

## 2017-05-28 NOTE — Unmapped (Addendum)
[facility] - Psychiatry Outpatient Clinic   After Visit Summary    Continue these medicines:    Medication changes: Start Remeron     Our next appointment is on Wed 06/19/17    [facility]: [phone].     If you are taking any controlled substances (such as anxiety or sleep medications), you must use them as the directions say to use them. We cannot provide early refills, and it would be inappropriate to obtain the medications from other doctors. We routinely use the [place] controlled substance database to monitor prescription drug use.     If you need to get in touch with me for a non-urgent issue, the best way to do so is through sending a [employer] using the Patient Advice Request.    You can also contact Myrene Galas, the [facility] Clinic Coordinator, at [phone] for any scheduling issues, or if you have an issue during business hours.    In order to receive updates on clinic closings due to adverse weather,   please call [phone].    The [place] crisis psychiatry line can be reached for after hours urgent issues at:   [phone]. You can also call the I need to talk line-- 1-800-273-TALK ([phone]) connects you to a skilled, trained counselor at a crisis center in your area, anytime 24/7.

## 2017-05-28 NOTE — Unmapped (Addendum)
It was a pleasure to see you in clinic today.  - On next vsit DO NOT take the tacrolimus beforehand so we can get a trough level  - For Ca/vit D supplements take 1000mg  -1200mg  calcium and at least 800IU Vit D  - We will see you back in 3 weeks with new scans and consideration of cycle 3     However, please call if you have any new symptoms or concerns and we will see you sooner.      Please call our nurse navigator if you have any interval questions or concerns:  Nurse Navigator: Leisa Lenz, RN:  [email or web address]  Nurse Triage Line: [phone]  ??  For emergencies, evenings or weekends, please call [phone] and ask for oncology fellow on call.  ??  For appointments, please call [phone].  For [facility] pharmacy, please call [phone].

## 2017-05-29 ENCOUNTER — Ambulatory Visit: Admission: RE | Admit: 2017-05-29 | Discharge: 2017-05-29 | Disposition: A | Discharge status: Home / Self Care

## 2017-05-29 DIAGNOSIS — C3431 Malignant neoplasm of lower lobe, right bronchus or lung: Principal | ICD-10-CM

## 2017-05-29 DIAGNOSIS — C7951 Secondary malignant neoplasm of bone: Secondary | ICD-10-CM

## 2017-05-29 NOTE — Unmapped (Signed)
Pt C/O heartburn in the lobby.  Jerilynn Som NP paged and she prescribed Maalox & IV pepcid. These were given with good effect.  NP will call in medication for heartburn to [facility] outpatient pharmacy. PO dexamethasone premed given.  PIV in situ from treatment yesterday.

## 2017-05-29 NOTE — Unmapped (Signed)
If you feel like this is an emergency please call 911.  For appointments or questions Monday through Friday 8AM-5PM please call [phone] or Toll Free [phone]. For Medical questions or concerns ask for the Nurse Triage Line.  On Nights, Weekends, and Holidays call [phone] and ask for the Oncologist on Call.  Reasons to call the Nurse Triage Line:  Fever of 100.5 or greater  Nausea and/or vomiting not relieved with nausea medicine  Diarrhea or constipation  Severe pain not relieved with usual pain regimen  Shortness of breath  Uncontrolled bleeding  Mental status changes

## 2017-05-29 NOTE — Unmapped (Signed)
1243-Patient states that his heartburn symptoms are much better since receiving the maalox and IV pepcid earlier. He has no needs at this time. IV NS infusion started. Waiting for the delivery of his chemotherapy infusion.     1403-Patient received treatment without complications. At completion of treatment PIV flushed, blood return noted and left in place for return appointment for tomorrow. Patient stable at discharge. Patient was instructed by the NP to take over the counter prilosec for heartburn symptom management and he verbalized understanding of the information given.

## 2017-05-30 ENCOUNTER — Ambulatory Visit
Admission: RE | Admit: 2017-05-30 | Discharge: 2017-05-30 | Disposition: A | Discharge status: Home / Self Care | Payer: MEDICARE

## 2017-05-30 DIAGNOSIS — C3431 Malignant neoplasm of lower lobe, right bronchus or lung: Principal | ICD-10-CM

## 2017-05-30 DIAGNOSIS — C7951 Secondary malignant neoplasm of bone: Secondary | ICD-10-CM

## 2017-05-30 NOTE — Unmapped (Signed)
1236 Pre med given.   1337 etoposide started. Pt denies any needs at this time. Call bell within reach. completed at 1439. No complications noted; pt tolerated treatment well.   1452 Neulasta onpro applied to left arm. Full indicator and green light flashing visualized. PIV flushed; blood return noted; removed. Given AVS. Pt discharged ambulatory in NAD from clinic.

## 2017-05-30 NOTE — Unmapped (Signed)
22 GA IV inserteded on 8/14 is patent, blood return noted.  NO issues reported.  To lobby to await treatment.

## 2017-05-30 NOTE — Unmapped (Signed)
If you feel like this is an emergency please call 911.  For appointments or questions Monday through Friday 8AM-5PM please call [phone] or Toll Free [phone]. For Medical questions or concerns ask for the Nurse Triage Line.  On Nights, Weekends, and Holidays call [phone] and ask for the Oncologist on Call.  Reasons to call the Nurse Triage Line:  Fever of 100.5 or greater  Nausea and/or vomiting not relieved with nausea medicine  Diarrhea or constipation  Severe pain not relieved with usual pain regimen  Shortness of breath  Uncontrolled bleeding  Mental status changes

## 2017-05-30 NOTE — Unmapped (Signed)
Labs found to be within parameters for treatment today. Request for drug sent to pharmacy.

## 2017-06-05 NOTE — Unmapped (Signed)
[facility] Refill Coordination Note  Specialty Medication(s): PROGRAF 1MG  AND ENTECAVIR 0.5MG   Additional Medications shipped: NO    Sherryl Manges, DOB: 01/28/1956  Phone: [phone] (home) , Alternate phone contact: N/A  Phone or address changes today?: No  All above HIPAA information was verified with patient.  Shipping Address: [address] [place] [address]   Insurance changes? No    Completed refill call assessment today to schedule patient's medication shipment from the [facility] [phone]).      Confirmed the medication and dosage are correct and have not changed: Yes, regimen is correct and unchanged.    Confirmed patient started or stopped the following medications in the past month:  No, there are no changes reported at this time.    Are you tolerating your medication?:  Jaidev reports tolerating the medication.    ADHERENCE    (Below is required for Medicare Part B or Transplant patients only - per drug): PROGRAF 1MG   How many tablets were dispensed last month: 150  Patient currently has 50 remaining.    Did you miss any doses in the past 4 weeks? No missed doses reported.    FINANCIAL/SHIPPING    Delivery Scheduled: Yes, Expected medication delivery date: 06/12/17     Meet did not have any additional questions at this time.    Delivery address validated in [facility] scheduling system: Yes, address listed in [facility] is correct.    We will follow up with patient monthly for standard refill processing and delivery.      Thank you,  Marletta Lor   [facility] Specialty Pharmacist

## 2017-06-05 NOTE — Unmapped (Signed)
Received call from pt's wife asking if he can just get transplant labs when he gets cancer labs. Agreed with her that it would be logical to do so. Entered lab order for when he gets his cancer labs drawn at [facility] next. She stated pt is stable but having to see psychology for emotional health. She is also getting psychological support.

## 2017-06-11 MED FILL — ENTECAVIR/0.5MG/TABS: ENTECAVIR/0.5MG/TABS | 30 days supply | Qty: 30 | Fill #2

## 2017-06-11 MED FILL — PROGRAF/1MG/CAP: PROGRAF/1MG/CAP | 30 days supply | Qty: 150 | Fill #2

## 2017-06-12 MED ORDER — OXYCODONE 10 MG TABLET
ORAL_TABLET | ORAL | 0 refills | 0.00000 days | Status: CP | PRN
Start: 2017-06-12 — End: 2017-07-22

## 2017-06-12 NOTE — Unmapped (Signed)
-----   Message from Christell Faith sent at 06/11/2017  3:30 PM EDT -----  Regarding: phone room  Contact: [phone]  Hi,    Patient Michael Barrett called requesting a medication refill for the following:    Medication: Oxycodone  Dosage: 10 mg  Days left of medication: 5    Patient???s pharmacy has been updated/verified in the system.      Please provide an expected turnaround time for completion of this request and I can provide an updates to Michael Barrett.      Thank you,  Christell Faith  [facility]  [phone]

## 2017-06-12 NOTE — Unmapped (Signed)
AOC Triage Note     Patient: Michael Barrett     Reason for call:  needs refill of Oxycodone    Time call returned: 1025     Phone Assessment: Spoke to wife and told her that I would forward to [facility] to refill. He would like to have this sent to the [facility] in [place].     Triage Recommendations: I deleted multiple pharmacies on his list with the assistance of his wife.      Patient Response: Thankful.     Outstanding tasks: Refill Oxycodnone to the [facility] in [place].

## 2017-06-14 ENCOUNTER — Ambulatory Visit
Admission: RE | Admit: 2017-06-14 | Discharge: 2017-06-14 | Disposition: A | Discharge status: Home / Self Care | Payer: MEDICARE

## 2017-06-14 DIAGNOSIS — C3490 Malignant neoplasm of unspecified part of unspecified bronchus or lung: Principal | ICD-10-CM
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
## 2017-06-18 ENCOUNTER — Ambulatory Visit
Admission: RE | Admit: 2017-06-18 | Discharge: 2017-06-18 | Disposition: A | Discharge status: Home / Self Care | Payer: MEDICARE | Attending: Hematology & Oncology | Admitting: Hematology & Oncology

## 2017-06-18 ENCOUNTER — Ambulatory Visit
Admission: RE | Admit: 2017-06-18 | Discharge: 2017-06-18 | Disposition: A | Discharge status: Home / Self Care | Payer: MEDICARE

## 2017-06-18 ENCOUNTER — Ambulatory Visit: Admission: RE | Admit: 2017-06-18 | Discharge: 2017-06-18 | Disposition: A | Discharge status: Home / Self Care

## 2017-06-18 DIAGNOSIS — Z79899 Other long term (current) drug therapy: Secondary | ICD-10-CM

## 2017-06-18 DIAGNOSIS — C3431 Malignant neoplasm of lower lobe, right bronchus or lung: Principal | ICD-10-CM

## 2017-06-18 DIAGNOSIS — C7951 Secondary malignant neoplasm of bone: Secondary | ICD-10-CM

## 2017-06-18 DIAGNOSIS — Z944 Liver transplant status: Secondary | ICD-10-CM

## 2017-06-18 DIAGNOSIS — C228 Malignant neoplasm of liver, primary, unspecified as to type: Principal | ICD-10-CM

## 2017-06-18 LAB — COMPREHENSIVE METABOLIC PANEL
ALBUMIN: 3.9 g/dL (ref 3.5–5.0)
ALKALINE PHOSPHATASE: 131 U/L — ABNORMAL HIGH (ref 38–126)
ALT (SGPT): 25 U/L (ref 19–72)
ANION GAP: 14 mmol/L (ref 9–15)
BILIRUBIN TOTAL: 0.7 mg/dL (ref 0.0–1.2)
BLOOD UREA NITROGEN: 20 mg/dL (ref 7–21)
BUN / CREAT RATIO: 17
CALCIUM: 8.6 mg/dL (ref 8.5–10.2)
CHLORIDE: 102 mmol/L (ref 98–107)
CO2: 21 mmol/L — ABNORMAL LOW (ref 22.0–30.0)
CREATININE: 1.15 mg/dL (ref 0.70–1.30)
EGFR MDRD AF AMER: >=60 mL/min/{1.73_m2} (ref >=60)
EGFR MDRD NON AF AMER: >=60 mL/min/{1.73_m2} (ref >=60)
GLUCOSE RANDOM: 155 mg/dL (ref 65–179)
POTASSIUM: 4.8 mmol/L (ref 3.5–5.0)
PROTEIN TOTAL: 7.2 g/dL (ref 6.5–8.3)
SODIUM: 137 mmol/L (ref 135–145)

## 2017-06-18 LAB — CBC W/ AUTO DIFF
BASOPHILS ABSOLUTE COUNT: 0 10*9/L (ref 0.0–0.1)
EOSINOPHILS ABSOLUTE COUNT: 0 10*9/L (ref 0.0–0.4)
HEMATOCRIT: 28.4 % — ABNORMAL LOW (ref 41.0–53.0)
HEMOGLOBIN: 9.4 g/dL — ABNORMAL LOW (ref 13.5–17.5)
LARGE UNSTAINED CELLS: 2 % (ref 0–4)
LYMPHOCYTES ABSOLUTE COUNT: 1.3 10*9/L — ABNORMAL LOW (ref 1.5–5.0)
MEAN CORPUSCULAR HEMOGLOBIN CONC: 32.9 g/dL (ref 31.0–37.0)
MEAN CORPUSCULAR HEMOGLOBIN: 30.3 pg (ref 26.0–34.0)
MEAN CORPUSCULAR VOLUME: 92.1 fL (ref 80.0–100.0)
MEAN PLATELET VOLUME: 8.2 fL (ref 7.0–10.0)
MONOCYTES ABSOLUTE COUNT: 0.6 10*9/L (ref 0.2–0.8)
NEUTROPHILS ABSOLUTE COUNT: 7 10*9/L (ref 2.0–7.5)
PLATELET COUNT: 225 10*9/L (ref 150–440)
RED BLOOD CELL COUNT: 3.09 10*12/L — ABNORMAL LOW (ref 4.50–5.90)
RED CELL DISTRIBUTION WIDTH: 19.8 % — ABNORMAL HIGH (ref 12.0–15.0)

## 2017-06-18 LAB — PHOSPHORUS
PHOSPHORUS: 3.5 mg/dL (ref 2.9–4.7)
Phosphate:MCnc:Pt:Ser/Plas:Qn:: 3.5

## 2017-06-18 LAB — SODIUM: Sodium:SCnc:Pt:Ser/Plas:Qn:: 137

## 2017-06-18 LAB — HEMOGLOBIN: Lab: 9.4 — ABNORMAL LOW

## 2017-06-18 NOTE — Unmapped (Signed)
Lab drawn and sent for analysis.

## 2017-06-18 NOTE — Unmapped (Addendum)
It was a pleasure to see you in clinic today.  - Unfortunately as we discussed, the scans show that while there are no new lesions, the one in the bones are larger and the one is the lung is also a bit larger.  - We discussed that the risk of liver rejection is high with immunotherapy and we recommend against this  - You indicated that you would like to try second line chemotherapy and we would switch to two drugs called cisplatin and irinotecan  - You have planned the bucket list trip and we can start chemotherapy when you return if that remains the goal  - Double the amount of calcium I.e take twice a day  - Let us know if the pain is not controlled as we can adjust medications  - We will see you back in clinic in 3 weeks after your trip  - Please call if you have any new symptoms or concerns and we will see you sooner.      Please call our nurse navigator if you have any interval questions or concerns:  Nurse Navigator: Leisa Lenz, RN:  [email or web address]  Nurse Triage Line: [phone]  ??  For emergencies, evenings or weekends, please call [phone] and ask for oncology fellow on call.  ??  For appointments, please call [phone].  For [facility] pharmacy, please call [phone].           cisplatin  Pronunciation:  sis PLA tin  Brand:  Platinol  What is the most important information I should know about cisplatin?  You should not receive cisplatin if you have kidney disease, bone marrow suppression, or hearing loss.  Cisplatin can harm your kidneys, and this effect is increased when you also use certain other medicines harmful to the kidneys. Before you receive cisplatin, tell your doctor about all other medications you use. Many other drugs (including some over-the-counter medicines) can be harmful to the kidneys. Call your doctor if you have little or no urinating, swelling or rapid weight gain, or shortness of breath.  Cisplatin can lower blood cells that help your body fight infections and help your blood to clot. You may get an infection or bleed more easily. Call your doctor if you have unusual bruising or bleeding, or signs of infection (fever, chills, body aches). Cisplatin can also affect your nervous system. Call your doctor if you have hearing problems, trouble with walking or daily activities, numbness, tingling, or cold feeling in your hands or feet.  What is cisplatin?  Cisplatin is a cancer medication that interferes with the growth of cancer cells and slows their growth and spread in the body.  Cisplatin is used together with other medications to treat bladder cancer, testicular cancer, or ovarian cancer.  Cisplatin may also be used for purposes not listed in this medication guide.  What should I discuss with my healthcare provider before receiving cisplatin?  You should not receive this medication if you are allergic to cisplatin or similar medications such as carboplatin (Paraplatin) or oxaliplatin (Eloxatin). You should not receive cisplatin if you have kidney disease, bone marrow suppression, or hearing loss.  To make sure cisplatin is safe for you, tell your doctor if you have:  ?? liver disease; or  ?? if you have ever received cisplatin in the past.  Using cisplatin may increase your risk of developing leukemia. Ask your doctor about your individual risk.  Do not use cisplatin if you are pregnant. It could harm the unborn baby. Use  effective birth control, and tell your doctor if you become pregnant during treatment. Follow your doctor's instructions about how long to prevent pregnancy after your treatment ends.  Cisplatin can pass into breast milk and may harm a nursing baby. Do not breast feed a baby while receiving this medication.  Serious side effects may be more likely in older adults using cisplatin.  How is cisplatin given?  Cisplatin is injected into a vein through an IV. You will receive this injection in a clinic or hospital setting.  You may be given IV fluids for 8 to 12 hours before you receive cisplatin.  Tell your caregivers if you feel any burning, pain, or swelling around the IV needle when cisplatin is injected.  Cisplatin can be harmful if it gets on your skin. If skin contact occurs, wash the area with soap and water.  Cisplatin can lower blood cells that help your body fight infections and help your blood to clot. Your blood will need to be tested often. Your cancer treatments may be delayed based on the results of these tests.  What happens if I miss a dose?  Call your doctor if you miss an appointment for your cisplatin injection.  What happens if I overdose?  Since this medication is given by a healthcare professional in a medical setting, an overdose is unlikely to occur.  What should I avoid while taking cisplatin?  This medicine can pass into body fluids (urine, feces, vomit). For at least 48 hours after you receive a dose, avoid allowing your body fluids to come into contact with your hands or other surfaces. Caregivers should wear rubber gloves while cleaning up a patient's body fluids, handling contaminated trash or laundry or changing diapers. Wash hands before and after removing gloves. Wash soiled clothing and linens separately from other laundry.  Avoid being near people who are sick or have infections. Tell your doctor at once if you develop signs of infection.  Cisplatin can cause side effects that may impair your vision. Be careful if you drive or do anything that requires you to be able to see clearly.  What are the possible side effects of cisplatin?  Get emergency medical help if you have signs of an allergic reaction:  hives; difficulty breathing; swelling of your face, lips, tongue, or throat.  Call your doctor at once if you have:  ?? hearing or vision problems, pain behind your eyes;  ?? trouble with walking or daily activities;  ?? numbness, tingling, or cold feeling in your hands or feet;  ?? drowsiness, mood changes, increased thirst, little or no urinating;  ?? swelling, weight gain, feeling short of breath;  ?? pale skin, easy bruising, unusual bleeding (nose, mouth, vagina, or rectum), purple or red pinpoint spots under your skin;  ?? fever, chills, body aches, flu symptoms, sores in your mouth and throat;  ?? severe or ongoing vomiting;  ?? chest pain or heavy feeling, pain spreading to the jaw or arm, nausea, sweating, general ill feeling;  ?? sudden numbness or weakness (especially on one side of the body), sudden severe headache, problems with speech or balance;  ?? low calcium (numbness or tingly feeling around your mouth, muscle tightness or contraction, overactive reflexes);  ?? high or low potassium (confusion, tingly feeling, slow or uneven heart rate, weak pulse, extreme thirst, increased urination, leg discomfort, muscle weakness or limp feeling); or  ?? low sodium (slurred speech, hallucinations, vomiting, severe weakness, muscle cramps, loss of coordination, feeling unsteady, fainting,  seizure, shallow breathing or breathing that stops).  Common side effects may include:  ?? decreased sense of taste;  ?? tired feeling;  ?? temporary hair loss; or  ?? pain, swelling, burning, or irritation around the IV needle.  This is not a complete list of side effects and others may occur. Call your doctor for medical advice about side effects. You may report side effects to FDA at 1-800-FDA-1088.  What other drugs will affect cisplatin?  Cisplatin can harm your kidneys. This effect is increased when you also use certain other medicines, including: antivirals, chemotherapy, injected antibiotics, medicine for bowel disorders, medicine to prevent organ transplant rejection, and some pain or arthritis medicines (including aspirin, Tylenol, Advil, and Aleve).  Tell your doctor about all medicines you use, and those you start or stop using during your treatment with cisplatin, especially:  ?? altretamine;  ?? vitamin B6 (pyridoxine); or  ?? seizure medication.  This list is not complete. Other drugs may interact with cisplatin, including prescription and over-the-counter medicines, vitamins, and herbal products. Not all possible interactions are listed in this medication guide.  Where can I get more information?  Your doctor or pharmacist can provide more information about cisplatin.    Remember, keep this and all other medicines out of the reach of children, never share your medicines with others, and use this medication only for the indication prescribed.  Every effort has been made to ensure that the information provided by [employer] ('[employer]') is accurate, up-to-date, and complete, but no guarantee is made to that effect. Drug information contained herein may be time sensitive. [employer] information has been compiled for use by healthcare practitioners and consumers in the [place] and therefore [employer] does not warrant that uses outside of the [place] are appropriate, unless specifically indicated otherwise. [employer]'s drug information does not endorse drugs, diagnose patients or recommend therapy. [employer]'s drug information is an Investment [employer], corporate to assist licensed healthcare practitioners in caring for their patients and/or to serve consumers viewing this service as a supplement to, and not a substitute for, the expertise, skill, knowledge and judgment of healthcare practitioners. The absence of a warning for a given drug or drug combination in no way should be construed to indicate that the drug or drug combination is safe, effective or appropriate for any given patient. Mult[employer] does not assume any responsibility for any aspect of healthcare administered with the aid of information [employer] provides. The information contained herein is not intended to cover all possible uses, directions, precautions, warnings, drug interactions, allergic reactions, or adverse effects. If you have questions about the drugs you are taking, check with your doctor, nurse or pharmacist.  Copyright [phone] [employer] Version: 7.04. Revision date: 07/06/2014.  Care instructions adapted under license by [facility]. If you have questions about a medical condition or this instruction, always ask your healthcare professional. [employer] disclaims any warranty or liability for your use of this information.         irinotecan  Pronunciation:  EYE ri noe TEE kan  Brand:  Camptosar  What is the most important information I should know about irinotecan?  Irinotecan can cause severe diarrhea, which can be life-threatening if it leads to dehydration. You may be given medicines to prevent or quickly treat diarrhea.  Call your doctor whenever you have diarrhea during your treatment with irinotecan.  What is irinotecan?  Irinotecan is a cancer medicine that interferes with the growth and spread of cancer  cells in the body.  Irinotecan is used to treat cancers of the colon and rectum. It is usually given with other cancer medicines in a combination chemotherapy.  Irinotecan may also be used for purposes not listed in this medication guide.  What should I discuss with my healthcare provider before receiving irinotecan?  You should not use irinotecan if you are allergic to it.  To make sure irinotecan is safe for you, tell your doctor if you have:  ?? liver or kidney disease;  ?? diabetes;  ?? asthma, chronic obstructive pulmonary disease (COPD), sleep apnea, or other breathing disorder;  ?? an intestinal disorder or obstruction;  ?? Gilbert's syndrome;  ?? fructose intolerance (irinotecan contains sorbitol); or  ?? if you are receiving radiation treatment to your abdomen or pelvic area.  Do not use irinotecan if you are pregnant. It could harm the unborn baby. Use effective birth control, and tell your doctor if you become pregnant during treatment.  It is not known whether irinotecan passes into breast milk or if it could harm a nursing baby. You should not breast-feed while you are using irinotecan.  How is irinotecan given?  Your doctor may recommend a DNA test before your first dose of irinotecan. Some people are genetically more likely to have certain side effects from irinotecan.  Irinotecan is injected into a vein through an IV. A healthcare provider will give you this injection.  The IV infusion can take up to 90 minutes to complete. Tell your caregivers if you feel any burning, pain, or swelling around the IV needle.  If any of this medicine gets on your skin, wash right away with soap and water.  You may be given medicine to prevent nausea, vomiting, diarrhea, and other side effects.  Irinotecan can cause severe diarrhea, which can be life-threatening if it leads to dehydration.  Your doctor may recommend you keep anti-diarrhea medicine (such as loperamide or Imodium) on hand at all times. Take the anti-diarrhea medicine at the first sign of loose or frequent bowel movements. Do not take loperamide for longer than 2 full days without your doctor's advice.  Call your doctor whenever you have diarrhea during your treatment with irinotecan.  You may need frequent medical tests to be sure this medicine is not causing harmful effects. Your cancer treatments may be delayed based on the results of these tests.  If you need surgery, tell the surgeon ahead of time that you are using irinotecan.  What happens if I miss a dose?  Call your doctor for instructions if you miss an appointment for your irinotecan injection.  What happens if I overdose?  Seek emergency medical attention or call the Poison Help line at [phone].  What should I avoid while using irinotecan?  Avoid using a laxative or stool softener during treatment with irinotecan.  Avoid being near people who are sick or have infections. Tell your doctor at once if you develop signs of infection.  Do not receive a live vaccine while using irinotecan, and avoid coming into contact with anyone who has recently received a live vaccine. There is a chance that the virus could be passed on to you. Live vaccines include measles, mumps, rubella (MMR), polio, rotavirus, typhoid, yellow fever, varicella (chickenpox), zoster (shingles), and nasal flu (influenza) vaccine.  Irinotecan may impair your thinking or reactions. Be careful if you drive or do anything that requires you to be alert.  This medicine can pass into body fluids (urine, feces, vomit). For  at least 48 hours after you receive a dose, avoid allowing your body fluids to come into contact with your hands or other surfaces. Caregivers should wear rubber gloves while cleaning up a patient's body fluids, handling contaminated trash or laundry or changing diapers. Wash hands before and after removing gloves. Wash soiled clothing and linens separately from other laundry.  What are the possible side effects of irinotecan?  Get emergency medical help if you have signs of an allergic reaction:  hives; difficult breathing; swelling of your face, lips, tongue, or throat.  Also call your doctor at once if you have:  ?? severe or ongoing vomiting or diarrhea;  ?? nausea or vomiting that keeps you from drinking enough fluids;  ?? sudden chest pain or discomfort, wheezing, dry cough, feeling short of breath;  ?? pain, redness, numbness, and peeling skin on your hands or feet;  ?? dehydration symptoms --feeling very thirsty or hot, being unable to urinate, heavy sweating, or hot and dry skin; or  ?? low blood cell counts --fever, chills, flu-like symptoms, swollen gums, mouth sores, skin sores, rapid heart rate, pale skin, easy bruising, unusual bleeding, feeling light-headed.  Common side effects may include:  ?? diarrhea, constipation;  ?? nausea, vomiting, stomach pain, loss of appetite;  ?? weakness;  ?? fever, pain, or other signs of infection;  ?? abnormal liver function tests; or  ?? temporary hair loss.  This is not a complete list of side effects and others may occur. Call your doctor for medical advice about side effects. You may report side effects to FDA at 1-800-FDA-1088.  What other drugs will affect irinotecan?  Before you are treated with irinotecan, tell your doctor about all other cancer medicines you have recently used.  Tell your doctor about all your current medicines and any you start or stop using, especially:  ?? nefazodone;  ?? St. John's wort;  ?? an antibiotic --clarithromycin, erythromycin, telithromycin;  ?? antifungal medicine --itraconazole, ketoconazole, voriconazole;  ?? seizure medicine --carbamazepine, fosphenytoin, oxcarbazepine, phenobarbital, phenytoin, primidone; or  ?? antiviral medicine to treat hepatitis or HIV/AIDS --atazanavir, boceprevir, cobicistat (Stribild, Tybost), delavirdine, efavirenz, fosamprenavir, indinavir, nelfinavir, nevirapine, ritonavir, saquinavir, telaprevir.  This list is not complete. Other drugs may interact with irinotecan, including prescription and over-the-counter medicines, vitamins, and herbal products. Not all possible interactions are listed in this medication guide.  Where can I get more information?  Your doctor or pharmacist can provide more information about irinotecan.    Remember, keep this and all other medicines out of the reach of children, never share your medicines with others, and use this medication only for the indication prescribed.  Every effort has been made to ensure that the information provided by [employer]') is accurate, up-to-date, and complete, but no guarantee is made to that effect. Drug information contained herein may be time sensitive. [employer] information has been compiled for use by healthcare practitioners and consumers in the [place] and therefore [employer] does not warrant that uses outside of the [place] are appropriate, unless specifically indicated otherwise. Mult[employer]'s drug information does not endorse drugs, diagnose patients or recommend therapy. [employer]'s drug information is an Investment banker, corporate to assist licensed healthcare practitioners in caring for their patients and/or to serve consumers viewing this service as a supplement to, and not a substitute for, the expertise, skill, knowledge and judgment of healthcare practitioners. The absence of a warning for a given drug or drug combination in no way should be construed to  indicate that the drug or drug combination is safe, effective or appropriate for any given patient. Mult[employer] does not assume any responsibility for any aspect of healthcare administered with the aid of information Mult[employer] provides. The information contained herein is not intended to cover all possible uses, directions, precautions, warnings, drug interactions, allergic reactions, or adverse effects. If you have questions about the drugs you are taking, check with your doctor, nurse or pharmacist.  Copyright [phone] [employer] Version: 4.05. Revision date: 07/20/2014.  Care instructions adapted under license by [facility]. If you have questions about a medical condition or this instruction, always ask your healthcare professional. [employer], Incorporated disclaims any warranty or liability for your use of this information.

## 2017-06-19 LAB — BILIRUBIN DIRECT: Bilirubin.glucuronidated:MCnc:Pt:Ser/Plas:Qn:: 0.3

## 2017-06-19 LAB — TACROLIMUS BLOOD: Lab: 3

## 2017-06-19 LAB — GAMMA GLUTAMYL TRANSFERASE: Gamma glutamyl transferase:CCnc:Pt:Ser/Plas:Qn:: 91

## 2017-06-19 LAB — MAGNESIUM: Magnesium:MCnc:Pt:Ser/Plas:Qn:: 1.9

## 2017-06-19 NOTE — Unmapped (Signed)
Call to pt as they moved his app and I missed him the other day. He says that he got some news that they needed to change his chemo as it had not been working. He says that he is doing a little better, however and he thinks that the mirtazapine has helped. He feels less angry all of the time. I will see him in October.   Fabiola Mudgett L. Gwinda Passe, MA   [facility]   [phone]

## 2017-06-20 NOTE — Unmapped (Addendum)
Confirmed with patient no missed doses of medication.  Going out of town tomorrow for about 2 weeks.  Confirmed he can go to [place] if needed.  05/28/17 lab is erroneous due to taking medication prior to lab draw.  Confirmed he is taking Tac 3 mg in am and 2 mg in pm.     Per NP Daphine Deutscher patient to repeat labs in 2 weeks. Patient verbalized understanding.

## 2017-06-28 NOTE — Unmapped (Signed)
8/31 Ct abd/chest/pelvis reviewed by ordering provider Racheal Patches, MD on 9/5 visit.

## 2017-06-28 NOTE — Unmapped (Signed)
Called pt and spoke to his wife checking on how he is doing with cancer diagnosis. Pt's wife stated he is in [place] on a trip with his friends. She stated he has to make some decisions on whether to continue with chemotherapy or not but for now he is enjoying the things he likes to do. Provided emotional support and asked her to call coordinator if needed. Informed her LFTs remain WNL. She verbalized understanding and appreciated call.

## 2017-07-05 NOTE — Unmapped (Signed)
Michael Barrett  Specialty Medication(s): prograf 1mg  and entecavir 0.5mg   Additional Medications shipped: no    Michael Barrett, DOB: 05-21-1956  Phone: [phone] (home) , Alternate phone contact: N/A  Phone or address changes today?: No  All above HIPAA information was verified with patient.  Shipping Address: [address] [place] [address]   Insurance changes? No    Completed refill call assessment today to schedule patient's medication shipment from the [facility] [phone]).      Confirmed the medication and dosage are correct and have not changed: Yes, regimen is correct and unchanged.    Confirmed patient started or stopped the following medications in the past month:  No, there are no changes reported at this time.    Are you tolerating your medication?:  Michael Barrett reports tolerating the medication.    ADHERENCE    Prograf 1 mg   Quantity filled last month: 150    # of tablets left on hand: 50      Did you miss any doses in the past 4 weeks? No missed doses reported.    FINANCIAL/SHIPPING    Delivery Scheduled: Yes, Expected medication delivery date: 07/12/17     Michael Barrett did not have any additional questions at this time.    Delivery address validated in [facility] scheduling system: Yes, address listed in [facility] is correct.    We will follow up with patient monthly for standard refill processing and delivery.      Thank you,  Marletta Lor   [facility] Specialty Pharmacist

## 2017-07-11 MED FILL — ENTECAVIR/0.5MG/TABS: ENTECAVIR/0.5MG/TABS | 30 days supply | Qty: 30 | Fill #3

## 2017-07-11 MED FILL — PROGRAF/1MG/CAP: PROGRAF/1MG/CAP | 30 days supply | Qty: 150 | Fill #3

## 2017-07-16 ENCOUNTER — Ambulatory Visit
Admission: RE | Admit: 2017-07-16 | Discharge: 2017-07-16 | Disposition: A | Discharge status: Home / Self Care | Payer: MEDICARE

## 2017-07-16 ENCOUNTER — Ambulatory Visit
Admission: RE | Admit: 2017-07-16 | Discharge: 2017-07-16 | Disposition: A | Discharge status: Home / Self Care | Payer: MEDICARE | Attending: Hematology & Oncology | Admitting: Hematology & Oncology

## 2017-07-16 ENCOUNTER — Ambulatory Visit
Admission: RE | Admit: 2017-07-16 | Discharge: 2017-07-16 | Disposition: A | Discharge status: Home / Self Care | Attending: Physician Assistant | Admitting: Physician Assistant

## 2017-07-16 DIAGNOSIS — Z944 Liver transplant status: Secondary | ICD-10-CM

## 2017-07-16 DIAGNOSIS — C220 Liver cell carcinoma: Secondary | ICD-10-CM

## 2017-07-16 DIAGNOSIS — C228 Malignant neoplasm of liver, primary, unspecified as to type: Secondary | ICD-10-CM

## 2017-07-16 DIAGNOSIS — C3431 Malignant neoplasm of lower lobe, right bronchus or lung: Principal | ICD-10-CM

## 2017-07-16 DIAGNOSIS — C7951 Secondary malignant neoplasm of bone: Secondary | ICD-10-CM

## 2017-07-16 DIAGNOSIS — Z5181 Encounter for therapeutic drug level monitoring: Secondary | ICD-10-CM

## 2017-07-16 LAB — COMPREHENSIVE METABOLIC PANEL
ALBUMIN: 3.6 g/dL (ref 3.5–5.0)
ALKALINE PHOSPHATASE: 119 U/L (ref 38–126)
ALT (SGPT): 23 U/L (ref 19–72)
ANION GAP: 12 mmol/L (ref 9–15)
AST (SGOT): 16 U/L — ABNORMAL LOW (ref 19–55)
BILIRUBIN TOTAL: 0.6 mg/dL (ref 0.0–1.2)
BLOOD UREA NITROGEN: 19 mg/dL (ref 7–21)
BUN / CREAT RATIO: 15
CHLORIDE: 99 mmol/L (ref 98–107)
CO2: 26 mmol/L (ref 22.0–30.0)
CREATININE: 1.24 mg/dL (ref 0.70–1.30)
EGFR MDRD AF AMER: >=60 mL/min/{1.73_m2} (ref >=60)
GLUCOSE RANDOM: 133 mg/dL (ref 65–179)
PROTEIN TOTAL: 7.2 g/dL (ref 6.5–8.3)
SODIUM: 137 mmol/L (ref 135–145)

## 2017-07-16 LAB — CBC W/ AUTO DIFF
BASOPHILS ABSOLUTE COUNT: 0 10*9/L (ref 0.0–0.1)
EOSINOPHILS ABSOLUTE COUNT: 0.1 10*9/L (ref 0.0–0.4)
HEMATOCRIT: 31.7 % — ABNORMAL LOW (ref 41.0–53.0)
HEMOGLOBIN: 10.1 g/dL — ABNORMAL LOW (ref 13.5–17.5)
LARGE UNSTAINED CELLS: 1 % (ref 0–4)
MEAN CORPUSCULAR HEMOGLOBIN: 27.7 pg (ref 26.0–34.0)
MEAN CORPUSCULAR VOLUME: 86.5 fL (ref 80.0–100.0)
MEAN PLATELET VOLUME: 8.2 fL (ref 7.0–10.0)
MONOCYTES ABSOLUTE COUNT: 0.5 10*9/L (ref 0.2–0.8)
PLATELET COUNT: 185 10*9/L (ref 150–440)
RED BLOOD CELL COUNT: 3.67 10*12/L — ABNORMAL LOW (ref 4.50–5.90)
RED CELL DISTRIBUTION WIDTH: 14.7 % (ref 12.0–15.0)
WBC ADJUSTED: 6.8 10*9/L (ref 4.5–11.0)

## 2017-07-16 LAB — HYPOCHROMIA

## 2017-07-16 LAB — CREATININE: Creatinine:MCnc:Pt:Ser/Plas:Qn:: 1.24

## 2017-07-16 LAB — SMEAR REVIEW

## 2017-07-16 LAB — MAGNESIUM: Magnesium:MCnc:Pt:Ser/Plas:Qn:: 1.7

## 2017-07-16 MED ORDER — CYCLOBENZAPRINE 5 MG TABLET
ORAL_TABLET | Freq: Three times a day (TID) | ORAL | 3 refills | 0 days | Status: CP | PRN
Start: 2017-07-16 — End: ?

## 2017-07-16 MED ORDER — SODIUM POLYSTYRENE SULFONATE 15 GRAM/60 ML ORAL SUSPENSION
Freq: Once | ORAL | 0 refills | 0.00000 days | Status: CP
Start: 2017-07-16 — End: 2017-07-16

## 2017-07-16 NOTE — Unmapped (Addendum)
It was a pleasure to see you in clinic today.  - For the pain, use the oxycodone 10mg  every 3 hours as needed. We can a long-acting later if we need it.  - We reordered the flexeril  - Continue mirtazipine  - We will facilitate connecting with hospice team for whenever you need that later  - We are happy to see you at any time.  We will plan on a clinic visit 80month from now and fine tochange this is you have other plans.    - Please call if you have any new symptoms or concerns and we will see you sooner.      Please call our nurse navigator if you have any interval questions or concerns:  Nurse Navigator: Leisa Lenz, RN:  [email or web address]  Nurse Triage Line: [phone]  ??  For emergencies, evenings or weekends, please call [phone] and ask for oncology fellow on call.  ??  For appointments, please call [phone].  For [facility] pharmacy, please call [phone].

## 2017-07-16 NOTE — Unmapped (Signed)
Lab drawn and sent for analysis, care provided by [facility] LPN

## 2017-07-17 LAB — TACROLIMUS BLOOD: Lab: 4.1

## 2017-07-17 NOTE — Unmapped (Signed)
Called and spoke to pt regarding 10/2 lab results. Pt stated his oncology team send script for kayexalate to treat elevated potassium. Pt can resume monthly labs.

## 2017-07-18 LAB — CBC W/ DIFFERENTIAL
BASOPHILS ABSOLUTE COUNT: 0 10*3/uL (ref 0.0–0.2)
BASOPHILS RELATIVE PERCENT: 0 %
EOSINOPHILS ABSOLUTE COUNT: 0.1 10*3/uL (ref 0.0–0.4)
EOSINOPHILS RELATIVE PERCENT: 1 %
HEMATOCRIT: 30.1 % — ABNORMAL LOW (ref 37.5–51.0)
HEMOGLOBIN: 9.9 g/dL — ABNORMAL LOW (ref 13.0–17.7)
LYMPHOCYTES ABSOLUTE COUNT: 1.4 10*3/uL (ref 0.7–3.1)
LYMPHOCYTES RELATIVE PERCENT: 17 %
MEAN CORPUSCULAR HEMOGLOBIN CONC: 32.9 g/dL (ref 31.5–35.7)
MEAN CORPUSCULAR HEMOGLOBIN: 26.9 pg (ref 26.6–33.0)
MEAN CORPUSCULAR VOLUME: 82 fL (ref 79–97)
MONOCYTES ABSOLUTE COUNT: 0.8 10*3/uL (ref 0.1–0.9)
NEUTROPHILS ABSOLUTE COUNT: 6.1 10*3/uL (ref 1.4–7.0)
NEUTROPHILS RELATIVE PERCENT: 73 %
PLATELET COUNT: 210 10*3/uL (ref 150–379)
RED BLOOD CELL COUNT: 3.68 x10E6/uL — ABNORMAL LOW (ref 4.14–5.80)
WHITE BLOOD CELL COUNT: 8.4 10*3/uL (ref 3.4–10.8)

## 2017-07-18 LAB — COMPREHENSIVE METABOLIC PANEL
A/G RATIO: 1.1 — ABNORMAL LOW (ref 1.2–2.2)
ALKALINE PHOSPHATASE: 129 IU/L — ABNORMAL HIGH (ref 39–117)
ALT (SGPT): 10 IU/L (ref 0–44)
AST (SGOT): 11 IU/L (ref 0–40)
BILIRUBIN TOTAL: 0.5 mg/dL (ref 0.0–1.2)
BLOOD UREA NITROGEN: 20 mg/dL (ref 8–27)
BUN / CREAT RATIO: 15 (ref 10–24)
CALCIUM: 9.9 mg/dL (ref 8.6–10.2)
CHLORIDE: 96 mmol/L (ref 96–106)
CREATININE: 1.32 mg/dL — ABNORMAL HIGH (ref 0.76–1.27)
GLOBULIN, TOTAL: 3.4 g/dL (ref 1.5–4.5)
GLUCOSE: 167 mg/dL — ABNORMAL HIGH (ref 65–99)
POTASSIUM: 4.8 mmol/L (ref 3.5–5.2)
SODIUM: 133 mmol/L — ABNORMAL LOW (ref 134–144)
TOTAL PROTEIN: 7.2 g/dL (ref 6.0–8.5)

## 2017-07-18 LAB — PHOSPHORUS: PHOSPHORUS, SERUM: 2.5 mg/dL (ref 2.5–4.5)

## 2017-07-18 LAB — BASOPHILS ABSOLUTE COUNT: Lab: 0

## 2017-07-18 LAB — SODIUM: Lab: 133 — ABNORMAL LOW

## 2017-07-18 LAB — MAGNESIUM: Lab: 1.6

## 2017-07-18 LAB — BILIRUBIN DIRECT: Lab: 0.13

## 2017-07-18 LAB — PHOSPHORUS, SERUM: Lab: 2.5

## 2017-07-18 MED FILL — SPS 15 GM/60 ML SUSPENSION: 15 | 1 days supply | Qty: 60 | Fill #0

## 2017-07-18 NOTE — Unmapped (Signed)
Called pt, spoke to pt's wife and notified her tac level was WNL. Pt will still need labs to recheck potassium level since he took Kayexalate as advised by oncology team. He can then resume monthly labs. Pt's wife verbalized understanding.

## 2017-07-19 LAB — GAMMA GLUTAMYL TRANSFERASE: Lab: 46

## 2017-07-19 NOTE — Unmapped (Signed)
Called and informed pt that his potassium is back WNL. Pt stated he was able to  Find and take  15 g of Kayexalate yesterday.

## 2017-07-20 LAB — TACROLIMUS BLOOD: Lab: 7.5

## 2017-07-20 LAB — CMV DNA, QUANTITATIVE, PCR

## 2017-07-20 LAB — LOG10 CMV QN DNA PL

## 2017-07-22 MED ORDER — OXYCODONE 10 MG TABLET
ORAL_TABLET | ORAL | 0 refills | 0 days | Status: CP | PRN
Start: 2017-07-22 — End: 2017-07-25

## 2017-07-24 MED ORDER — MORPHINE ER 15 MG TABLET,EXTENDED RELEASE
ORAL_TABLET | Freq: Two times a day (BID) | ORAL | 0 refills | 0.00000 days | Status: CP
Start: 2017-07-24 — End: ?

## 2017-07-25 MED ORDER — OXYCODONE 10 MG TABLET
ORAL_TABLET | ORAL | 0 refills | 0 days | Status: CP | PRN
Start: 2017-07-25 — End: ?

## 2017-07-26 NOTE — Unmapped (Signed)
MEDICATION PRIOR AUTHORIZATION NOTE    I completed a prior authorization for oxycodone 10 mg (#180 tabs for 22 days supply).      Medication directions: Take 1 tablet (10 mg) every 3 hours as needed for pain. ??  Prescription insurance carrier: [employer]  BIN: [id number]  PCN: [id number]  ID: [id number]    Method of contact: Fax  Approval dates: 10/15/2017 - 10/14/2018  [place] approval number: [id number]/A    I am suspicious of a typo in the approval dates that the start date should read 10/15/2016 not 2019, but if there are issues in denial of this medication in the remainder of 2018, please call [employer] at [phone].       Leane Platt, PharmD  PGY2 Hem/Onc Pharmacy Resident  [phone]

## 2017-07-31 NOTE — Unmapped (Signed)
[facility] Refill Coordination Note  Specialty Medication(s): Prograf 1mg  & Entecavir 0.5mg       Michael Barrett, DOB: 24-Jan-1956  Phone: [phone] (home) , Alternate phone contact: N/A  Phone or address changes today?: No  All above HIPAA information was verified with patient.  Shipping Address: [address] [place] [address]   Insurance changes? No    Completed refill call assessment today to schedule patient's medication shipment from the [facility] [phone]).      Confirmed the medication and dosage are correct and have not changed: Yes, regimen is correct and unchanged.    Confirmed patient started or stopped the following medications in the past month:  No, there are no changes reported at this time.    Are you tolerating your medication?:  Michael Barrett reports tolerating the medication.    ADHERENCE    (Below is required for Medicare Part B or Transplant patients only - per drug):   How many tablets were dispensed last month:   Prograf 1 mg   Quantity filled last month: 150   # of tablets left on hand: Patient states he has 7 days left     Did you miss any doses in the past 4 weeks? No missed doses reported.    FINANCIAL/SHIPPING    Delivery Scheduled: Yes, Expected medication delivery date: 08/08/17     [name] did not have any additional questions at this time.    Delivery address validated in [facility] scheduling system: Yes, address listed in [facility] is correct.    We will follow up with patient monthly for standard refill processing and delivery.      Thank you,  Tamala Fothergill   [facility] Specialty Technician

## 2017-08-07 MED FILL — PROGRAF/1MG/CAP: PROGRAF/1MG/CAP | 30 days supply | Qty: 150 | Fill #4

## 2017-08-07 MED FILL — ENTECAVIR/0.5MG/TABS: ENTECAVIR/0.5MG/TABS | 30 days supply | Qty: 30 | Fill #4

## 2017-08-13 LAB — CBC W/ DIFFERENTIAL
BASOPHILS ABSOLUTE COUNT: 0 10*9/L
BASOPHILS ABSOLUTE COUNT: 0 10*3/uL (ref 0.0–0.2)
BASOPHILS RELATIVE PERCENT: 0 %
EOSINOPHILS ABSOLUTE COUNT: 0.1 10*9/L
EOSINOPHILS ABSOLUTE COUNT: 0.1 10*3/uL (ref 0.0–0.4)
EOSINOPHILS RELATIVE PERCENT: 1 %
HEMATOCRIT: 30 % — ABNORMAL LOW
HEMATOCRIT: 30 % — ABNORMAL LOW (ref 37.5–51.0)
HEMOGLOBIN: 9.9 g/dL — ABNORMAL LOW
HEMOGLOBIN: 9.9 g/dL — ABNORMAL LOW (ref 13.0–17.7)
LYMPHOCYTES ABSOLUTE COUNT: 0.9 10*9/L
LYMPHOCYTES RELATIVE PERCENT: 11 %
MEAN CORPUSCULAR HEMOGLOBIN CONC: 33 g/dL (ref 31.5–35.7)
MEAN CORPUSCULAR HEMOGLOBIN: 25.4 pg — ABNORMAL LOW (ref 26.6–33.0)
MEAN CORPUSCULAR VOLUME: 77 fL — ABNORMAL LOW (ref 79–97)
MONOCYTES ABSOLUTE COUNT: 0.9 10*9/L
MONOCYTES ABSOLUTE COUNT: 0.9 10*3/uL (ref 0.1–0.9)
MONOCYTES RELATIVE PERCENT: 11 %
NEUTROPHILS ABSOLUTE COUNT: 6.2 10*9/L
NEUTROPHILS ABSOLUTE COUNT: 6.2 10*3/uL (ref 1.4–7.0)
NEUTROPHILS RELATIVE PERCENT: 77 %
PLATELET COUNT: 300 10*9/L
PLATELET COUNT: 300 10*3/uL (ref 150–379)
RED BLOOD CELL COUNT: 3.9 x10E6/uL — ABNORMAL LOW (ref 4.14–5.80)
RED CELL DISTRIBUTION WIDTH: 16.3 % — ABNORMAL HIGH (ref 12.3–15.4)
WHITE BLOOD CELL COUNT: 8.1 10*9/L
WHITE BLOOD CELL COUNT: 8.1 10*3/uL (ref 3.4–10.8)

## 2017-08-13 LAB — SODIUM: Lab: 133 — ABNORMAL LOW

## 2017-08-13 LAB — COMPREHENSIVE METABOLIC PANEL
A/G RATIO: 0.9 — ABNORMAL LOW (ref 1.2–2.2)
ALBUMIN: 3.6 g/dL (ref 3.6–4.8)
ALKALINE PHOSPHATASE: 279 U/L — ABNORMAL HIGH
ALKALINE PHOSPHATASE: 279 IU/L — ABNORMAL HIGH (ref 39–117)
ALT (SGPT): 89 U/L — ABNORMAL HIGH
ALT (SGPT): 89 IU/L — ABNORMAL HIGH (ref 0–44)
AST (SGOT): 50 U/L — ABNORMAL HIGH
AST (SGOT): 50 IU/L — ABNORMAL HIGH (ref 0–40)
BILIRUBIN TOTAL: 0.4 mg/dL
BILIRUBIN TOTAL: 0.4 mg/dL (ref 0.0–1.2)
BLOOD UREA NITROGEN: 30 mg/dL — ABNORMAL HIGH
BLOOD UREA NITROGEN: 30 mg/dL — ABNORMAL HIGH (ref 8–27)
BUN / CREAT RATIO: 21 (ref 10–24)
CALCIUM: 11.3 mg/dL — ABNORMAL HIGH (ref 8.6–10.2)
CHLORIDE: 96 mmol/L
CO2: 22 mmol/L
CO2: 22 mmol/L (ref 20–29)
CREATININE: 1.45 mg/dL — ABNORMAL HIGH
CREATININE: 1.45 mg/dL — ABNORMAL HIGH (ref 0.76–1.27)
GLUCOSE RANDOM: 194 mg/dL — ABNORMAL HIGH
GLUCOSE: 194 mg/dL — ABNORMAL HIGH (ref 65–99)
POTASSIUM: 4.7 mmol/L
POTASSIUM: 4.7 mmol/L (ref 3.5–5.2)
SODIUM: 133 mmol/L — ABNORMAL LOW (ref 134–144)
TOTAL PROTEIN: 7.4 g/dL (ref 6.0–8.5)

## 2017-08-13 LAB — ALBUMIN
Lab: 3.6
Lab: 3.6

## 2017-08-13 LAB — BILIRUBIN DIRECT
Lab: 0.12
Lab: 0.12

## 2017-08-13 LAB — PHOSPHORUS
Lab: 3
PHOSPHORUS, SERUM: 3 mg/dL (ref 2.5–4.5)

## 2017-08-13 LAB — MAGNESIUM
Lab: 2.2
Lab: 2.2

## 2017-08-13 LAB — PHOSPHORUS, SERUM: Lab: 3

## 2017-08-13 LAB — LYMPHOCYTES RELATIVE PERCENT: Lab: 0

## 2017-08-13 LAB — EOSINOPHILS ABSOLUTE COUNT: Lab: 0.1

## 2017-08-14 LAB — GAMMA GLUTAMYL TRANSFERASE: Lab: 158 — ABNORMAL HIGH

## 2017-08-15 LAB — LOG10 CMV QN DNA PL

## 2017-08-15 LAB — TACROLIMUS BLOOD: Lab: 8.4

## 2017-08-15 MED ORDER — PROGRAF 1 MG CAPSULE: 2 mg | capsule | Freq: Two times a day (BID) | 11 refills | 0 days | Status: AC

## 2017-08-15 MED ORDER — PROGRAF 1 MG CAPSULE
ORAL_CAPSULE | Freq: Two times a day (BID) | ORAL | 11 refills | 0.00000 days | Status: CP
Start: 2017-08-15 — End: 2017-08-15

## 2017-08-15 NOTE — Unmapped (Signed)
Reviewed 10/30 tac level with Jennette Banker PharmD. Recommendation was to decrease Prograf to 2 mg BID. Called pt and spoke to his wife. Pt will repeat labs on Tuesday since he is coming to the hospital for another appt. She mentioned pt stopped all chemotherapy and he is weak but comfortable.

## 2017-08-16 MED ORDER — PROGRAF 1 MG CAPSULE: 2 mg | capsule | Freq: Two times a day (BID) | 11 refills | 0 days | Status: AC

## 2017-08-16 MED ORDER — PROGRAF 1 MG CAPSULE
ORAL_CAPSULE | Freq: Two times a day (BID) | ORAL | 11 refills | 0.00000 days | Status: CP
Start: 2017-08-16 — End: 2017-08-21

## 2017-08-16 NOTE — Unmapped (Signed)
Reviewed low positive CMV with Jennette Banker PharmD. Recommendation was to recheck in 2 weeks. Called pt and spoke to his wife and relayed info. She mentioned that pt has poor appetite and dislikes taking morphine for pain even though he does not have adequate pain control. Advised her to call oncology since they are managing his pain and to see if they recommend any appetite stimulants. Pt's wife verbalized understanding.

## 2017-08-19 NOTE — Unmapped (Signed)
Call from wife - stated that hospice will not come out until patient stopped using antirejection medication.  Wife told them that he would die without it.   Reports today that patient is confused and refusing to eat or drink.  Trying to get him to drink fluids.  Ca level was elevated last week and he has increased confusion since.     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 Path showed tumor was necrotic and localized - no extension.   Records faxed to hospice to review and discuss with attending and will call wife and admit to hospice care today.     Attempted to reach wife multiple times via cell and landline - will attempt to continue to reach out to her

## 2017-08-21 MED ORDER — TACROLIMUS 0.5 MG/ML ORAL SUSPENSION
Freq: Two times a day (BID) | ORAL | 11 refills | 0.00000 days | Status: CP
Start: 2017-08-21 — End: 2018-08-16

## 2017-08-21 NOTE — Unmapped (Unsigned)
GOT NEW RX FOR PROGRAF 0.5MG /ML SUSP, HAS BEEN ON PROGRAF 1MG  CAPS.

## 2017-08-22 NOTE — Unmapped (Signed)
Received call from pt's wife reporting that pt is on hospice. She stated she is having to crush pt's medications as he has altered mental status and struggling with medication intake. She wanted to know how to handle Prograf as it is not supposed to be crushed. Informed her that we could try the oral suspension and see if his insurance will approve. Reviewed pt with Jennette Banker PharmD and recommendation was to order the same dose that pt is taking. Send script to [facility], awaiting response on insurance approval.

## 2017-08-23 NOTE — Unmapped (Signed)
Received notification from Shared services that Tacrolimus suspension is ready for shipment but they have not been able to get in touch with pt. Called pt, spoke to his wife who stated hospice has d/c all his medications except pain meds. She did not know if pt still needed to take the Prograf. She stated she would have hospice nurse call me to discuss.Received call from director of nursing at  [facility] stating that pt is actively dying and is only getting pain medication for comfort. Relayed info to shared services.

## 2017-09-14 DEATH — deceased
